# Patient Record
Sex: Female | Born: 1955 | Race: White | Hispanic: No | Smoking: Current every day smoker
Health system: Southern US, Community
[De-identification: ages and names within clinical notes are randomized; demographics above are authoritative.]

## PROBLEM LIST (undated history)

## (undated) DIAGNOSIS — I10 Essential (primary) hypertension: Secondary | ICD-10-CM

## (undated) DIAGNOSIS — T8859XA Other complications of anesthesia, initial encounter: Secondary | ICD-10-CM

## (undated) DIAGNOSIS — K219 Gastro-esophageal reflux disease without esophagitis: Secondary | ICD-10-CM

## (undated) DIAGNOSIS — Z933 Colostomy status: Secondary | ICD-10-CM

## (undated) DIAGNOSIS — T4145XA Adverse effect of unspecified anesthetic, initial encounter: Secondary | ICD-10-CM

## (undated) DIAGNOSIS — J449 Chronic obstructive pulmonary disease, unspecified: Secondary | ICD-10-CM

## (undated) DIAGNOSIS — K5792 Diverticulitis of intestine, part unspecified, without perforation or abscess without bleeding: Secondary | ICD-10-CM

## (undated) HISTORY — PX: BACK SURGERY: SHX140

## (undated) HISTORY — PX: TONSILLECTOMY: SUR1361

---

## 2002-07-30 ENCOUNTER — Ambulatory Visit (HOSPITAL_COMMUNITY): Admission: RE | Admit: 2002-07-30 | Discharge: 2002-07-30 | Payer: Self-pay | Admitting: Obstetrics and Gynecology

## 2002-07-30 ENCOUNTER — Encounter: Payer: Self-pay | Admitting: Obstetrics and Gynecology

## 2004-08-09 ENCOUNTER — Ambulatory Visit (HOSPITAL_COMMUNITY): Admission: RE | Admit: 2004-08-09 | Discharge: 2004-08-09 | Payer: Self-pay | Admitting: Family Medicine

## 2004-11-15 ENCOUNTER — Ambulatory Visit (HOSPITAL_COMMUNITY): Admission: RE | Admit: 2004-11-15 | Discharge: 2004-11-15 | Payer: Self-pay | Admitting: Family Medicine

## 2005-08-22 ENCOUNTER — Ambulatory Visit (HOSPITAL_COMMUNITY): Admission: RE | Admit: 2005-08-22 | Discharge: 2005-08-22 | Payer: Self-pay | Admitting: Family Medicine

## 2005-09-04 ENCOUNTER — Other Ambulatory Visit: Admission: RE | Admit: 2005-09-04 | Discharge: 2005-09-04 | Payer: Self-pay

## 2006-03-06 ENCOUNTER — Ambulatory Visit (HOSPITAL_COMMUNITY): Admission: RE | Admit: 2006-03-06 | Discharge: 2006-03-06 | Payer: Self-pay | Admitting: Family Medicine

## 2012-04-19 ENCOUNTER — Emergency Department (HOSPITAL_COMMUNITY)
Admission: EM | Admit: 2012-04-19 | Discharge: 2012-04-19 | Disposition: A | Discharge status: Home / Self Care | Payer: Self-pay | Attending: Emergency Medicine | Admitting: Emergency Medicine

## 2012-04-19 ENCOUNTER — Encounter (HOSPITAL_COMMUNITY): Payer: Self-pay | Admitting: Emergency Medicine

## 2012-04-19 ENCOUNTER — Emergency Department (HOSPITAL_COMMUNITY): Payer: Self-pay

## 2012-04-19 DIAGNOSIS — Y92009 Unspecified place in unspecified non-institutional (private) residence as the place of occurrence of the external cause: Secondary | ICD-10-CM | POA: Insufficient documentation

## 2012-04-19 DIAGNOSIS — R059 Cough, unspecified: Secondary | ICD-10-CM | POA: Insufficient documentation

## 2012-04-19 DIAGNOSIS — S82142A Displaced bicondylar fracture of left tibia, initial encounter for closed fracture: Secondary | ICD-10-CM

## 2012-04-19 DIAGNOSIS — S82109A Unspecified fracture of upper end of unspecified tibia, initial encounter for closed fracture: Secondary | ICD-10-CM | POA: Insufficient documentation

## 2012-04-19 DIAGNOSIS — R002 Palpitations: Secondary | ICD-10-CM | POA: Insufficient documentation

## 2012-04-19 DIAGNOSIS — T148XXA Other injury of unspecified body region, initial encounter: Secondary | ICD-10-CM | POA: Insufficient documentation

## 2012-04-19 DIAGNOSIS — R05 Cough: Secondary | ICD-10-CM | POA: Insufficient documentation

## 2012-04-19 HISTORY — DX: Essential (primary) hypertension: I10

## 2012-04-19 MED ORDER — HYDROCODONE-ACETAMINOPHEN 5-325 MG PO TABS: 1.0000 | ORAL_TABLET | Freq: Four times a day (QID) | ORAL | Status: AC | PRN

## 2012-04-19 MED ORDER — NAPROXEN 500 MG PO TABS: 500.0000 mg | ORAL_TABLET | Freq: Two times a day (BID) | ORAL | Status: AC

## 2012-04-19 MED ORDER — TETANUS-DIPHTH-ACELL PERTUSSIS 5-2.5-18.5 LF-MCG/0.5 IM SUSP
0.5000 mL | Freq: Once | INTRAMUSCULAR | Status: AC
  Administered 2012-04-19: 0.5 mL via INTRAMUSCULAR
  Filled 2012-04-19: qty 0.5

## 2012-04-19 MED ORDER — AMOXICILLIN-POT CLAVULANATE 875-125 MG PO TABS: 1.0000 | ORAL_TABLET | Freq: Two times a day (BID) | ORAL | Status: AC
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
## 2012-04-19 NOTE — Discharge Instructions (Signed)
Views left knee immobilizer all times except for during showering use crutches at all times. Referral made to both orthopedic surgeons in town and one a call for followup for the tibial plateau fracture on Monday. Take antibiotic as directed for the heel puncture wound. Tetanus updated today. Return as needed.   RESOURCE GUIDE  Chronic Pain Problems: Contact [facility]  [phone] Patients need to be referred by their primary care doctor.  Insufficient Money for Medicine: Contact United Way:  call "211" or [facility] [phone].  No Primary Care Doctor: - Call [facility]  [phone] - can help you locate a primary care doctor that  accepts your insurance, provides certain services, etc. - Physician Referral Service- [phone]  Agencies that provide inexpensive medical care: - [facility]  [phone] - [facility]  [phone] - [facility]  [phone] - Women's Clinic  [phone] - [facility]  [phone] [facility]  [phone]  Medicaid-accepting [facility] Providers: - [facility]- [address]  [phone], Mon-Fri 9am-7pm, Sat 9am-1pm - [facility]- [address] [place], Suite [place]  [phone] - [facility]- [address] [place]  [phone] [facility]- [address]  [phone] - Renaye Rakers- [address] [place], [address], [phone]  Only accepts [place] patients after they have their name  applied to their card  Self Pay (no insurance) in [place]: - Sickle Cell Patients: Dr Willey Blade, [facility]  [address] [place], [phone] - [facility]- [address] [place]  [phone]       [facility]- [address]       -     [facility]- see information above (Speak to [employer] if you  do not have insurance)       -  [facility]- [address] [place], [phone]       -  [facility]- [address],  [phone]       -  [facility]- [address] [phone]       -  Dr Julio Sicks-  [address], [place], [phone]       -  [facility]- [address] [phone]       -  [facility]- [address] [phone], also [address], [phone]       -    [facility]- [address] [place], [phone], 1st & 3rd Saturday   every month, 10am-1pm  1) Find a Doctor and Pay Out of Pocket Although you won't have to find out who is covered by your insurance plan, it is a good idea to ask around and get recommendations. You will then need to call the office and see if the doctor you have chosen will accept you as a new patient and what types of options they offer for patients who are self-pay. Some doctors offer discounts or will set up payment plans for their patients who do not have insurance, but you will need to ask so you aren't surprised when you get to your appointment.  2) Contact Your Local Health Department Not all health departments have doctors that can see patients for  sick visits, but many do, so it is worth a call to see if yours does. If you don't know where your local health department is, you can check in your phone book. The CDC also has a tool to help you locate your state's health department, and many state websites also have listings of all of their local health departments.  3) Find a Walk-in Clinic If your illness is not likely to be very severe or complicated, you may want to try a walk in clinic. These are popping up all over the country in pharmacies, drugstores, and shopping centers. They're usually staffed by nurse practitioners or physician assistants that have been trained to treat common illnesses and complaints. They're usually fairly quick and inexpensive. However, if you have  serious medical issues or chronic medical problems, these are probably not your best option  STD Testing - [facility] Department of [facility], STD Clinic, [address] [place], phone [phone] or [phone].  Monday - Friday, call for an appointment. [facility] Department of [employer], STD Clinic, [address], [place], phone [phone] or [phone].  Monday - Friday, call for an appointment.  Abuse/Neglect: [facility] Child Abuse Hotline 250 008 8013 [facility] Child Abuse Hotline (510)730-1198 (After Hours)  Emergency Shelter:  [facility] 3150113134  Maternity Homes: - Room at the [place] [facility] [phone] - [facility] [phone]  MRSA Hotline #:   925 126 9159  [facility] Resources  [facility]  [employer] [facility] Dept. [address]                 [address]         [address] [place] [address]  Blondell Reveal Phone:  [phone]                                  Phone:  [phone]                   Phone:  ([phone]  [facility], [phone] - [phone]       -     [facility] in [place], [address],                                  ([phone], [facility] Child Abuse Hotline 330-479-3501 or (587)842-1306 (After Hours)   Behavioral Health Services  Substance Abuse Resources: - [facility]  769-774-4213 - [facility] (519) [phone] - [facility] [place] [phone] Floyd[facility] Flock [phone]) [phone] - Residential & Outpatient Substance Abuse Program  [phone]  Psychological Services: [facility]  [phone] Services  ([phone] - [facility], ([phone] [place]. [address] [place], ACCESS LINE:  212-740-3468 or [phone], [employer]  Dental Assistance  If unable to pay or uninsured, contact:  [facility] or [facility]. to become qualified for the adult dental clinic.  Patients with Medicaid: [facility] [phone] [address], [phone] [address] [phone]  If unable to pay, or uninsured, contact [facility] (541)350-3448) or [facility] [phone] in [place], [phone] in [facility]) to become qualified for the adult dental clinic  Other Low-Cost Community Dental Services: - [facility]- [address] [place], [place], [place], [address], [phone], [address]. [phone], 2nd and 4th Thursday of the month at 6:30am.  10 clients each day by appointment, can sometimes see walk-in patients if someone does not show for an appointment. [facility]- [address] [place], [place], [address], [phone] - [facility]- [address] [place], [place], [address], [phone] - [facility]- [phone] [facility]- [phone] [facility] Department- [phone]
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
## 2012-04-21 ENCOUNTER — Telehealth: Payer: Self-pay | Admitting: Orthopedic Surgery
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
## 2012-04-24 NOTE — Telephone Encounter (Signed)
She does not need an appointment at this time. When she can get her insurance issues together and/or appropriate funds she can come in for an review that her fracture is minimally displaced and would be treated in a brace anyway.

## 2012-04-24 NOTE — Telephone Encounter (Signed)
Unable to reach patient as she had explained she does not have a phone (NOT at her home ph#) therefore would be calling us.  Will advise per Dr. Mort Sawyers response when she contacts [place].

## 2012-05-12 ENCOUNTER — Ambulatory Visit (INDEPENDENT_AMBULATORY_CARE_PROVIDER_SITE_OTHER): Payer: Self-pay | Admitting: Orthopedic Surgery

## 2012-05-12 ENCOUNTER — Ambulatory Visit (INDEPENDENT_AMBULATORY_CARE_PROVIDER_SITE_OTHER): Payer: Self-pay

## 2012-05-12 ENCOUNTER — Encounter: Payer: Self-pay | Admitting: Orthopedic Surgery

## 2012-05-12 VITALS — BP 140/90 | Ht 66.0 in | Wt 170.0 lb

## 2012-05-12 DIAGNOSIS — S82109A Unspecified fracture of upper end of unspecified tibia, initial encounter for closed fracture: Secondary | ICD-10-CM

## 2012-05-12 DIAGNOSIS — S82143A Displaced bicondylar fracture of unspecified tibia, initial encounter for closed fracture: Secondary | ICD-10-CM

## 2012-05-12 NOTE — Patient Instructions (Addendum)
Start weight  bearing with crutches / brace for 6 weeks   Start bending the knee 25 times 3 times daily

## 2012-05-15 ENCOUNTER — Encounter: Payer: Self-pay | Admitting: Orthopedic Surgery

## 2012-05-15 DIAGNOSIS — S82143A Displaced bicondylar fracture of unspecified tibia, initial encounter for closed fracture: Secondary | ICD-10-CM | POA: Insufficient documentation

## 2012-05-15 NOTE — Progress Notes (Signed)
  Subjective:    Patient ID: Glenda Hill, female    DOB: 03-31-1956, 56 y.o.   MRN: [id number]  HPI    Review of Systems  Musculoskeletal: Positive for myalgias, joint swelling, arthralgias and gait problem.  All other systems reviewed and are negative.       Objective:   Physical Exam  Constitutional: She is oriented to person, place, and time. She appears well-developed and well-nourished.  Neck: Neck supple.  Cardiovascular: Intact distal pulses.   Pulmonary/Chest: Effort normal.  Abdominal: She exhibits no distension.  Musculoskeletal:       BP 140/90  Ht 5\' 6"  (1.676 m)  Wt 170 lb (77.111 kg)  BMI 27.44 kg/m2  Normal grooming and hygiene she is oriented x3  Her left knee is stiff and swollen and tender especially over the lateral plateau ligaments are stable including collateral leg the skin is intact distal pulses are excellent there is no lymphadenopathy sensations normal reflexes cannot be assessed right to left overall coordination and balance normal  Neurological: She is alert and oriented to person, place, and time. She has normal reflexes. She displays normal reflexes. No cranial nerve deficit. She exhibits normal muscle tone. Coordination normal.  Skin: Skin is warm and dry.  Psychiatric: She has a normal mood and affect. Her behavior is normal. Judgment and thought content normal.   X-rays show nondisplaced minimally depressed lateral plateau fracture       Assessment & Plan:  Recommend hinged knee brace with T. scope brace weight-bear as tolerated active range of motion return 6 weeks for x-ray

## 2012-07-07 ENCOUNTER — Ambulatory Visit (INDEPENDENT_AMBULATORY_CARE_PROVIDER_SITE_OTHER): Payer: Self-pay | Admitting: Orthopedic Surgery

## 2012-07-07 ENCOUNTER — Encounter: Payer: Self-pay | Admitting: Orthopedic Surgery

## 2012-07-07 VITALS — BP 120/78 | Ht 66.0 in | Wt 166.0 lb

## 2012-07-07 DIAGNOSIS — S82143A Displaced bicondylar fracture of unspecified tibia, initial encounter for closed fracture: Secondary | ICD-10-CM

## 2012-07-07 DIAGNOSIS — S82109A Unspecified fracture of upper end of unspecified tibia, initial encounter for closed fracture: Secondary | ICD-10-CM
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
## 2012-07-07 NOTE — Patient Instructions (Signed)
Wean yourself out of the brace

## 2013-06-15 DIAGNOSIS — Z01810 Encounter for preprocedural cardiovascular examination: Secondary | ICD-10-CM

## 2014-12-28 ENCOUNTER — Encounter (HOSPITAL_COMMUNITY): Payer: Self-pay | Admitting: Emergency Medicine

## 2014-12-28 ENCOUNTER — Emergency Department (HOSPITAL_COMMUNITY)
Admission: EM | Admit: 2014-12-28 | Discharge: 2014-12-28 | Disposition: A | Discharge status: Home / Self Care | Payer: 59 | Attending: Emergency Medicine | Admitting: Emergency Medicine

## 2014-12-28 ENCOUNTER — Emergency Department (HOSPITAL_COMMUNITY): Payer: 59

## 2014-12-28 DIAGNOSIS — M791 Myalgia: Secondary | ICD-10-CM | POA: Diagnosis not present

## 2014-12-28 DIAGNOSIS — I10 Essential (primary) hypertension: Secondary | ICD-10-CM | POA: Insufficient documentation

## 2014-12-28 DIAGNOSIS — R1013 Epigastric pain: Secondary | ICD-10-CM | POA: Diagnosis not present

## 2014-12-28 DIAGNOSIS — R112 Nausea with vomiting, unspecified: Secondary | ICD-10-CM | POA: Insufficient documentation

## 2014-12-28 DIAGNOSIS — Z720 Tobacco use: Secondary | ICD-10-CM | POA: Insufficient documentation

## 2014-12-28 DIAGNOSIS — M545 Low back pain: Secondary | ICD-10-CM | POA: Diagnosis not present

## 2014-12-28 DIAGNOSIS — M542 Cervicalgia: Secondary | ICD-10-CM | POA: Insufficient documentation

## 2014-12-28 DIAGNOSIS — R079 Chest pain, unspecified: Secondary | ICD-10-CM | POA: Diagnosis present

## 2014-12-28 LAB — CBC WITH DIFFERENTIAL/PLATELET
Basophils Absolute: 0.2 10*3/uL — ABNORMAL HIGH (ref 0.0–0.1)
Basophils Relative: 3 % — ABNORMAL HIGH (ref 0–1)
Eosinophils Absolute: 0.3 10*3/uL (ref 0.0–0.7)
Eosinophils Relative: 5 % (ref 0–5)
HCT: 38.5 % (ref 36.0–46.0)
Hemoglobin: 12.7 g/dL (ref 12.0–15.0)
Lymphocytes Relative: 45 % (ref 12–46)
Lymphs Abs: 3.1 10*3/uL (ref 0.7–4.0)
MCH: 29.3 pg (ref 26.0–34.0)
MCHC: 33 g/dL (ref 30.0–36.0)
MCV: 88.7 fL (ref 78.0–100.0)
Monocytes Absolute: 0.4 10*3/uL (ref 0.1–1.0)
Monocytes Relative: 6 % (ref 3–12)
Neutro Abs: 2.8 10*3/uL (ref 1.7–7.7)
Neutrophils Relative %: 42 % — ABNORMAL LOW (ref 43–77)
Platelets: 240 10*3/uL (ref 150–400)
RBC: 4.34 MIL/uL (ref 3.87–5.11)
RDW: 13.7 % (ref 11.5–15.5)
WBC: 6.8 10*3/uL (ref 4.0–10.5)

## 2014-12-28 LAB — COMPREHENSIVE METABOLIC PANEL
ALK PHOS: 68 U/L (ref 39–117)
ALT: 11 U/L (ref 0–35)
AST: 16 U/L (ref 0–37)
Albumin: 4.1 g/dL (ref 3.5–5.2)
Anion gap: 4 — ABNORMAL LOW (ref 5–15)
BUN: 30 mg/dL — ABNORMAL HIGH (ref 6–23)
CO2: 27 mmol/L (ref 19–32)
Calcium: 9.3 mg/dL (ref 8.4–10.5)
Chloride: 109 mmol/L (ref 96–112)
Creatinine, Ser: 0.8 mg/dL (ref 0.50–1.10)
GFR calc Af Amer: >90 mL/min (ref >90)
GFR calc non Af Amer: 80 mL/min — ABNORMAL LOW (ref >90)
GLUCOSE: 97 mg/dL (ref 70–99)
POTASSIUM: 3.4 mmol/L — AB (ref 3.5–5.1)
Sodium: 140 mmol/L (ref 135–145)
Total Bilirubin: 0.4 mg/dL (ref 0.3–1.2)
Total Protein: 6.6 g/dL (ref 6.0–8.3)

## 2014-12-28 LAB — TROPONIN I: Troponin I: <0.03 ng/mL (ref <0.031)

## 2014-12-28 LAB — LIPASE, BLOOD: LIPASE: 26 U/L (ref 11–59)

## 2014-12-28 MED ORDER — OXYCODONE-ACETAMINOPHEN 5-325 MG PO TABS: 1.0000 | ORAL_TABLET | ORAL | Status: DC | PRN

## 2014-12-28 MED ORDER — ONDANSETRON HCL 4 MG/2ML IJ SOLN: 4.0000 mg | Freq: Once | INTRAMUSCULAR | Status: DC

## 2014-12-28 MED ORDER — MORPHINE SULFATE 2 MG/ML IJ SOLN: 2.0000 mg | Freq: Once | INTRAMUSCULAR | Status: DC

## 2014-12-28 NOTE — ED Provider Notes (Signed)
CSN: [id number]     Arrival date & time 12/28/14  1842 History  This chart was scribed for Hilario Quarryanielle S , MD by Richarda Overlieichard Holland, ED Scribe. This patient was seen in room APA02/APA02 and the patient's care was started 8:32 PM.    Chief Complaint  Patient presents with  . Chest Pain   Patient is a 59 y.o. female presenting with chest pain. The history is provided by the patient. No language interpreter was used.  Chest Pain Pain location:  Epigastric Pain radiates to the back: yes   Pain severity:  Mild Duration:  24 hours Timing:  Constant Progression:  Unable to specify Associated symptoms: back pain, nausea and vomiting   Associated symptoms: no shortness of breath    HPI Comments: Glenda Hill is a 59 y.o. female with a history of HTN who presents to the Emergency Department complaining of constant CP that started last night around 8PM and worsened today. Pt rates her pain as a 4/10 at this time. She describes the pain as sharp and sometimes it subsides and turns into a dull pain. Pt reports associated nausea and says she vomited last night. She says that her CP radiates to her back. Pt also complains of neck pain. She says that she took 4 ibuprofen earlier which failed to provide relief to her symptoms. Pt reports a history of smoking and says she occasionally drinks. Pt reports a past surgical history of cyst removal on her lumbar spine. She denies SOB.   Pt also complains of pain in her left hip area which she thought was from her bed mattress. She states that she began sciatica treatment for her right leg pain 9 months ago.   Past Medical History  Diagnosis Date  . Hypertension    Past Surgical History  Procedure Laterality Date  . Cesarean section     Family History  Problem Relation Age of Onset  . Heart disease    . Arthritis    . Lung disease    . Cancer    . Diabetes     History  Substance Use Topics  . Smoking status: Current Every Day Smoker -- 1.00  packs/day    Types: Cigarettes  . Smokeless tobacco: Not on file  . Alcohol Use: Yes   OB History    No data available     Review of Systems  Respiratory: Negative for shortness of breath.   Cardiovascular: Positive for chest pain.  Gastrointestinal: Positive for nausea and vomiting.  Musculoskeletal: Positive for myalgias, back pain and neck pain.  All other systems reviewed and are negative.    Allergies  Review of patient's allergies indicates no known allergies.  Home Medications   Prior to Admission medications   Medication Sig Start Date End Date Taking? Authorizing Provider  IBUPROFEN PO Take by mouth.    Historical Provider, MD  METOPROLOL TARTRATE PO Take by mouth.    Historical Provider, MD   BP 165/87 mmHg  Pulse 61  Temp(Src) 97.2 F (36.2 C) (Oral)  Resp 14  Ht 5\' 7"  (1.702 m)  Wt 140 lb (63.504 kg)  BMI 21.92 kg/m2  SpO2 97% Physical Exam  Constitutional: She is oriented to person, place, and time. She appears well-developed and well-nourished.  HENT:  Head: Normocephalic and atraumatic.  Right Ear: External ear normal.  Left Ear: External ear normal.  Nose: Nose normal.  Mouth/Throat: Oropharynx is clear and moist.  Eyes: Conjunctivae and EOM are normal. Pupils are  equal, round, and reactive to light. Right eye exhibits no discharge. Left eye exhibits no discharge.  Neck: Normal range of motion. Neck supple. No JVD present. No tracheal deviation present. No thyromegaly present.  Cardiovascular: Normal rate, regular rhythm, normal heart sounds and intact distal pulses.   Pulmonary/Chest: Effort normal and breath sounds normal. No respiratory distress. She has no wheezes.  Abdominal: Soft. Bowel sounds are normal. She exhibits no distension and no mass. There is tenderness. There is no rebound and no guarding.    Musculoskeletal: Normal range of motion.  Lymphadenopathy:    She has no cervical adenopathy.  Neurological: She is alert and oriented to  person, place, and time. She has normal reflexes. No cranial nerve deficit or sensory deficit. Gait normal. GCS eye subscore is 4. GCS verbal subscore is 5. GCS motor subscore is 6.  Reflex Scores:      Bicep reflexes are 2+ on the right side and 2+ on the left side.      Patellar reflexes are 2+ on the right side and 2+ on the left side. Strength is normal and equal throughout. Cranial nerves grossly intact. Patient fluent. No gross ataxia and patient able to ambulate without difficulty.  Skin: Skin is warm and dry.  Psychiatric: She has a normal mood and affect. Her behavior is normal. Judgment and thought content normal.  Nursing note and vitals reviewed.   ED Course  Procedures   DIAGNOSTIC STUDIES: Oxygen Saturation is 97% on RA, normal by my interpretation.    COORDINATION OF CARE: 8:38 PM Discussed treatment plan with pt at bedside and pt agreed to plan.   Labs Review Labs Reviewed  CBC WITH DIFFERENTIAL/PLATELET - Abnormal; Notable for the following:    Neutrophils Relative % 42 (*)    Basophils Relative 3 (*)    Basophils Absolute 0.2 (*)    All other components within normal limits  COMPREHENSIVE METABOLIC PANEL - Abnormal; Notable for the following:    Potassium 3.4 (*)    BUN 30 (*)    GFR calc non Af Amer 80 (*)    Anion gap 4 (*)    All other components within normal limits  TROPONIN I  LIPASE, BLOOD    Imaging Review Dg Chest 2 View  12/28/2014   CLINICAL DATA:  Left-sided chest pain.  EXAM: CHEST  2 VIEW  COMPARISON:  June 15, 2013.  FINDINGS: The heart size and mediastinal contours are within normal limits. Both lungs are clear. No pneumothorax or pleural effusion is noted. The visualized skeletal structures are unremarkable.  IMPRESSION: No acute cardiopulmonary abnormality seen.   Electronically Signed   By: Lupita Raider, M.D.   On: 12/28/2014 19:34     EKG Interpretation   Date/Time:  Tuesday December 28 2014 18:54:35 EST Ventricular Rate:  62 PR  Interval:  134 QRS Duration: 86 QT Interval:  396 QTC Calculation: 401 R Axis:   77 Text Interpretation:  Normal sinus rhythm Normal ECG Confirmed by  MD,   ([id number]) on 12/28/2014 8:40:21 PM      MDM   Final diagnoses:  Epigastric abdominal pain   I personally performed the services described in this documentation, which was scribed in my presence. The recorded information has been reviewed and considered.      Hilario Quarry, MD 12/28/14 [phone]

## 2014-12-28 NOTE — ED Notes (Signed)
Pt c/o left chest pain since 1300 with radiation down left side, left jaw and into back. Pt reports feeling her heart start racing then slow down. Pain is intermittent and fluctuates in severity.

## 2014-12-28 NOTE — Discharge Instructions (Signed)

## 2014-12-28 NOTE — ED Notes (Signed)
Patient states that she has to drive home and does not care to take the pain medication. Spoke with Dr. Rosalia Hammersay concerning the medication. She will come talk to the patient.

## 2014-12-29 ENCOUNTER — Ambulatory Visit (HOSPITAL_COMMUNITY)
Admit: 2014-12-29 | Discharge: 2014-12-29 | Disposition: A | Discharge status: Home / Self Care | Payer: 59 | Source: Ambulatory Visit | Attending: Emergency Medicine | Admitting: Emergency Medicine

## 2014-12-29 ENCOUNTER — Other Ambulatory Visit (HOSPITAL_COMMUNITY): Payer: Self-pay | Admitting: Emergency Medicine

## 2014-12-29 DIAGNOSIS — R1013 Epigastric pain: Secondary | ICD-10-CM

## 2015-01-03 MED FILL — Oxycodone w/ Acetaminophen Tab 5-325 MG: ORAL | Qty: 6 | Status: AC

## 2015-02-09 ENCOUNTER — Emergency Department (HOSPITAL_COMMUNITY): Payer: 59

## 2015-02-09 ENCOUNTER — Emergency Department (HOSPITAL_COMMUNITY)
Admission: EM | Admit: 2015-02-09 | Discharge: 2015-02-09 | Disposition: A | Discharge status: Home / Self Care | Payer: 59 | Attending: Emergency Medicine | Admitting: Emergency Medicine

## 2015-02-09 ENCOUNTER — Encounter (HOSPITAL_COMMUNITY): Payer: Self-pay | Admitting: *Deleted

## 2015-02-09 DIAGNOSIS — Y9289 Other specified places as the place of occurrence of the external cause: Secondary | ICD-10-CM | POA: Insufficient documentation

## 2015-02-09 DIAGNOSIS — Y998 Other external cause status: Secondary | ICD-10-CM | POA: Diagnosis not present

## 2015-02-09 DIAGNOSIS — S8991XA Unspecified injury of right lower leg, initial encounter: Secondary | ICD-10-CM | POA: Diagnosis not present

## 2015-02-09 DIAGNOSIS — Z720 Tobacco use: Secondary | ICD-10-CM | POA: Diagnosis not present

## 2015-02-09 DIAGNOSIS — Y9389 Activity, other specified: Secondary | ICD-10-CM | POA: Insufficient documentation

## 2015-02-09 DIAGNOSIS — I10 Essential (primary) hypertension: Secondary | ICD-10-CM | POA: Diagnosis not present

## 2015-02-09 DIAGNOSIS — M25561 Pain in right knee: Secondary | ICD-10-CM

## 2015-02-09 DIAGNOSIS — X58XXXA Exposure to other specified factors, initial encounter: Secondary | ICD-10-CM | POA: Insufficient documentation

## 2015-02-09 DIAGNOSIS — Z79899 Other long term (current) drug therapy: Secondary | ICD-10-CM | POA: Insufficient documentation

## 2015-02-09 MED ORDER — OXYCODONE-ACETAMINOPHEN 5-325 MG PO TABS
1.0000 | ORAL_TABLET | Freq: Once | ORAL | Status: AC
  Administered 2015-02-09: 1 via ORAL
  Filled 2015-02-09: qty 1

## 2015-02-09 MED ORDER — OXYCODONE-ACETAMINOPHEN 5-325 MG PO TABS: 1.0000 | ORAL_TABLET | Freq: Four times a day (QID) | ORAL | Status: DC | PRN

## 2015-02-09 NOTE — ED Provider Notes (Signed)
CSN: [id number]     Arrival date & time 02/09/15  [id number] History   First MD Initiated Contact with Patient 02/09/15 [phone]     Chief Complaint  Patient presents with  . Knee Pain     (Consider location/radiation/quality/duration/timing/severity/associated sxs/prior Treatment) HPI  This a 59 year old female with a history of hypertension who presents with right knee pain. Patient reports approximately 2 weeks ago she twisted her knee and heard a "pop." Since that time she has had pain with ambulation. Pain is worsened with standing for long durations. Tonight she was work and reported 10 out of 10 pain. When she is sitting it is 7 out of 10. It is a throbbing and nonradiating pain. She's not noted any overlying skin changes. Denies any fevers. Has taken ibuprofen with minimal relief.  Past Medical History  Diagnosis Date  . Hypertension    Past Surgical History  Procedure Laterality Date  . Cesarean section     Family History  Problem Relation Age of Onset  . Heart disease    . Arthritis    . Lung disease    . Cancer    . Diabetes     History  Substance Use Topics  . Smoking status: Current Every Day Smoker -- 1.00 packs/day    Types: Cigarettes  . Smokeless tobacco: Not on file  . Alcohol Use: Yes   OB History    No data available     Review of Systems  Constitutional: Negative for fever.  Musculoskeletal:       Right knee pain  Skin: Negative for color change.  All other systems reviewed and are negative.     Allergies  Review of patient's allergies indicates no known allergies.  Home Medications   Prior to Admission medications   Medication Sig Start Date End Date Taking? Authorizing Provider  IBUPROFEN PO Take by mouth.    Historical Provider, MD  METOPROLOL TARTRATE PO Take by mouth.    Historical Provider, MD  oxyCODONE-acetaminophen (PERCOCET/ROXICET) 5-325 MG per tablet Take 1 tablet by mouth every 6 (six) hours as needed for severe pain. 02/09/15    Shon Batonourtney F , MD   BP 168/95 mmHg  Pulse 70  Temp(Src) 97.6 F (36.4 C) (Oral)  Resp 18  Ht 5\' 7"  (1.702 m)  Wt 135 lb (61.236 kg)  BMI 21.14 kg/m2  SpO2 99% Physical Exam  Constitutional: She is oriented to person, place, and time. She appears well-developed and well-nourished.  HENT:  Head: Normocephalic and atraumatic.  Mouth/Throat: Oropharynx is clear and moist.  Cardiovascular: Normal rate and regular rhythm.   Pulmonary/Chest: Effort normal. No respiratory distress.  Musculoskeletal:  Focused examination of the right knee reveals normal range of motion, no joint line tenderness, minimal swelling, no significant laxity, no overlying skin changes, crepitus with range of motion  Neurological: She is alert and oriented to person, place, and time.  Skin: Skin is warm and dry.  Psychiatric: She has a normal mood and affect.  Nursing note and vitals reviewed.   ED Course  Procedures (including critical care time) Labs Review Labs Reviewed - No data to display  Imaging Review Dg Knee Complete 4 Views Right  02/09/2015   CLINICAL DATA:  Left knee pain. Felt a pop 2 weeks prior, increasing pain since that time.  EXAM: RIGHT KNEE - COMPLETE 4+ VIEW  COMPARISON:  None.  FINDINGS: No acute fracture. Mild moderate medial tibial femoral joint space narrowing with small peripheral osteophytes. There is  a 9 mm osseous projection from the proximal lateral tibial metaphysis with cortical and medullary continuity consistent with osteochondroma. Moderate joint effusion.  IMPRESSION: 1. Moderate joint effusion.  No acute osseous abnormality. 2. 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 3. Small osteochondroma projecting from the lateral tibial metaphysis measures 9 mm.   Electronically Signed   By: Rubye Oaks M.D.   On: 02/09/2015 04:19     EKG Interpretation None      MDM   Final diagnoses:  Knee pain, acute, right    Patient presents with knee pain.  No evidence of infection. Reports twisting injury. Patient given Percocet. Plain films negative. Suspect ligamentous versus meniscal injury. There was a moderate effusion on x-ray. Discuss with patient rest, ice, compression, and elevation. Patient was provided with an Ace bandage. Given orthopedics referral if pain does not improve.  After history, exam, and medical workup I feel the patient has been appropriately medically screened and is safe for discharge home. Pertinent diagnoses were discussed with the patient. Patient was given return precautions.     Shon Baton, MD 02/09/15 [phone]

## 2015-02-09 NOTE — Discharge Instructions (Signed)

## 2015-02-09 NOTE — ED Notes (Signed)
Pt c/o right knee pain; pt states she heard a "pop" 2 weeks ago and the pain has been getting progressively worse; pt states she was at work tonight when the pain got worse

## 2015-02-09 NOTE — ED Notes (Signed)
Ace wrap applied to right knee

## 2015-12-25 ENCOUNTER — Emergency Department (HOSPITAL_COMMUNITY)
Admission: EM | Admit: 2015-12-25 | Discharge: 2015-12-25 | Disposition: A | Discharge status: Home / Self Care | Payer: BLUE CROSS/BLUE SHIELD | Attending: Emergency Medicine | Admitting: Emergency Medicine

## 2015-12-25 ENCOUNTER — Emergency Department (HOSPITAL_COMMUNITY): Payer: BLUE CROSS/BLUE SHIELD

## 2015-12-25 ENCOUNTER — Encounter (HOSPITAL_COMMUNITY): Payer: Self-pay | Admitting: Nurse Practitioner

## 2015-12-25 DIAGNOSIS — F17210 Nicotine dependence, cigarettes, uncomplicated: Secondary | ICD-10-CM | POA: Insufficient documentation

## 2015-12-25 DIAGNOSIS — I10 Essential (primary) hypertension: Secondary | ICD-10-CM | POA: Diagnosis not present

## 2015-12-25 DIAGNOSIS — R079 Chest pain, unspecified: Secondary | ICD-10-CM

## 2015-12-25 LAB — I-STAT TROPONIN, ED
TROPONIN I, POC: 0 ng/mL (ref 0.00–0.08)
Troponin i, poc: 0 ng/mL (ref 0.00–0.08)

## 2015-12-25 LAB — CBC
HCT: 42.9 % (ref 36.0–46.0)
Hemoglobin: 14.5 g/dL (ref 12.0–15.0)
MCH: 29.9 pg (ref 26.0–34.0)
MCHC: 33.8 g/dL (ref 30.0–36.0)
MCV: 88.5 fL (ref 78.0–100.0)
Platelets: 290 10*3/uL (ref 150–400)
RBC: 4.85 MIL/uL (ref 3.87–5.11)
RDW: 14.1 % (ref 11.5–15.5)
WBC: 6.3 10*3/uL (ref 4.0–10.5)

## 2015-12-25 LAB — BASIC METABOLIC PANEL
Anion gap: 16 — ABNORMAL HIGH (ref 5–15)
BUN: 10 mg/dL (ref 6–20)
CO2: 25 mmol/L (ref 22–32)
CREATININE: 0.71 mg/dL (ref 0.44–1.00)
Calcium: 9.7 mg/dL (ref 8.9–10.3)
Chloride: 99 mmol/L — ABNORMAL LOW (ref 101–111)
GFR calc Af Amer: >60 mL/min (ref >60)
GFR calc non Af Amer: >60 mL/min (ref >60)
Glucose, Bld: 98 mg/dL (ref 65–99)
Potassium: 3.8 mmol/L (ref 3.5–5.1)
Sodium: 140 mmol/L (ref 135–145)

## 2015-12-25 MED ORDER — AMLODIPINE BESYLATE 10 MG PO TABS: 10.0000 mg | ORAL_TABLET | Freq: Every day | ORAL | Status: DC

## 2015-12-25 NOTE — ED Notes (Signed)
She c/o midsternal cp radiating into her back and L arm and nausea onset last night while exerting herself at work, pain persisted throughout the night. shes noticed her heart has been racing this week also. Denies sob. Has felt weaker than normal. A&Ox4, resp e/u

## 2015-12-25 NOTE — Discharge Instructions (Signed)

## 2015-12-25 NOTE — ED Provider Notes (Signed)
CSN: [id number]     Arrival date & time 12/25/15  1637 History   First MD Initiated Contact with Patient 12/25/15 1830     Chief Complaint  Patient presents with  . Chest Pain     (Consider location/radiation/quality/duration/timing/severity/associated sxs/prior Treatment) HPI   54 y f w PMH htn who was at work last night when she began having central chest pain that radiated to her back and L arm.  She describes the pain as burning.  The pain was constant overnight but has improved from 10/10 to 3/10.  Patient hasn't taken anything for the pain at home.  No sob, no cough.  No fever/chills.  Past Medical History  Diagnosis Date  . Hypertension    Past Surgical History  Procedure Laterality Date  . Cesarean section    . Back surgery     Family History  Problem Relation Age of Onset  . Heart disease    . Arthritis    . Lung disease    . Cancer    . Diabetes     Social History  Substance Use Topics  . Smoking status: Current Every Day Smoker -- 1.00 packs/day    Types: Cigarettes  . Smokeless tobacco: None  . Alcohol Use: Yes   OB History    No data available     Review of Systems  Constitutional: Negative for fever and chills.  HENT: Negative for nosebleeds.   Eyes: Negative for visual disturbance.  Respiratory: Negative for cough and shortness of breath.   Cardiovascular: Positive for chest pain.  Gastrointestinal: Negative for nausea, vomiting, abdominal pain, diarrhea and constipation.  Genitourinary: Negative for dysuria.  Skin: Negative for rash.  Neurological: Negative for weakness.  All other systems reviewed and are negative.     Allergies  Review of patient's allergies indicates no known allergies.  Home Medications   Prior to Admission medications   Medication Sig Start Date End Date Taking? Authorizing Provider  IBUPROFEN PO Take by mouth.    Historical Provider, MD  METOPROLOL TARTRATE PO Take by mouth.    Historical Provider, MD   oxyCODONE-acetaminophen (PERCOCET/ROXICET) 5-325 MG per tablet Take 1 tablet by mouth every 6 (six) hours as needed for severe pain. 02/09/15   Shon Baton, MD   BP 152/64 mmHg  Pulse 72  Temp(Src) 97.9 F (36.6 C) (Oral)  Resp 20  SpO2 96% Physical Exam  Constitutional: She is oriented to person, place, and time. No distress.  HENT:  Head: Normocephalic and atraumatic.  Eyes: EOM are normal. Pupils are equal, round, and reactive to light.  Neck: Normal range of motion. Neck supple.  Cardiovascular: Normal rate, normal heart sounds and intact distal pulses.   Pulmonary/Chest: Effort normal and breath sounds normal. No respiratory distress.  Abdominal: Soft. There is no tenderness.  Musculoskeletal: Normal range of motion.  Neurological: She is alert and oriented to person, place, and time.  Skin: No rash noted. She is not diaphoretic.  Psychiatric: She has a normal mood and affect.    ED Course  Procedures (including critical care time) Labs Review Labs Reviewed  BASIC METABOLIC PANEL - Abnormal; Notable for the following:    Chloride 99 (*)    Anion gap 16 (*)    All other components within normal limits  CBC  I-STAT TROPOININ, ED  Rosezena Sensor, ED    Imaging Review Dg Chest 2 View  12/25/2015  CLINICAL DATA:  Initial evaluation for acute centralized chest pain radiating  to back. EXAM: CHEST  2 VIEW COMPARISON:  Prior radiograph from 12/29/2014. FINDINGS: The cardiac and mediastinal silhouettes are stable in size and contour, and remain within normal limits. Atheromatous plaque within the aortic arch noted. The lungs are normally inflated. No airspace consolidation, pleural effusion, or pulmonary edema is identified. There is no pneumothorax. No acute osseous abnormality identified. IMPRESSION: No active cardiopulmonary disease. Electronically Signed   By: Rise Mu M.D.   On: 12/25/2015 17:50   I have personally reviewed and evaluated these images and  lab results as part of my medical decision-making.   EKG Interpretation   Date/Time:  Sunday December 25 2015 16:45:15 EST Ventricular Rate:  83 PR Interval:  134 QRS Duration: 84 QT Interval:  368 QTC Calculation: 432 R Axis:   77 Text Interpretation:  Normal sinus rhythm Right atrial enlargement  Moderate voltage criteria for LVH, may be normal variant Borderline ECG  Confirmed by ZAVITZ  MD, JOSHUA ([id number]) on 12/25/2015 8:08:42 PM      MDM   Final diagnoses:  Chest pain, unspecified chest pain type    47 y f w PMH htn who was at work last night when she began having central chest pain that radiated to her back and L arm.  Exam as above.  AFVSS.  No murmur heard  ekg w/o ischemic changes.  Cbc/bmp wnl.  Trop neg. cxr wnl. HEAR of 3.  Doubt dissection given age, pain improving, equal radial pulses bilaterally.  Normal mediastinum on cxr Doubt PE given no prev dvt/pe, not pleuritic pain, not tachy, no hypoxia, no leg swelling Have done delta trop which is neg.  Pain is improving.  Feel safe for d/c at this time with close cards f/u  I have discussed the results, Dx and Tx plan with the pt. They expressed understanding and agree with the plan and were told to return to ED with any worsening of condition or concern.    Disposition: Discharge  Condition: Good  New Prescriptions   No medications on file    Follow Up: [facility] [address] [place] [address] [phone] Schedule an appointment as soon as possible for a visit in 3 days    Pt seen in conjunction with Dr. Nelda Bucks, MD 12/25/15 2011  Blane Ohara, MD 12/26/15 [phone]

## 2015-12-26 NOTE — Progress Notes (Signed)
Patient ID: Glenda Hill, female   DOB: January 20, 1956, 60 y.o.   MRN: [id number]     Cardiology Office Note   Date:  12/27/2015   [name], DOB [birth year], MRN [id number]  PCP:  No PCP Per Patient  Cardiologist:   Charlton Haws, MD   Chief Complaint  Patient presents with  . Establish Care      History of Present Illness: 60 y.o.  f w PMH htn who was seen in ER 12/25/15 with chest pain.  At work  when she began having central chest pain that radiated to her back and L arm. She describes the pain as burning. The pain was constant overnight and persisted in ER visit.  Patient hasn't taken anything for the pain at home. No sob, no cough. No fever/chills. She r/o had no acute ECG changes and negative tropinin .  CRF;  HTN   CXR NAD no CE and normal mediastinum reviewed.   She has over 50 py history smoking and clinical COPD.  No primary  Has not been on BP pills Did not think norvasc effective in past Describes cough with  Lisinopril.    Pain started after lifting heavy items at work. 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  Not pleuritic  Piercing stabbing pain including epigastric area       Past Medical History  Diagnosis Date  . Hypertension     Past Surgical History  Procedure Laterality Date  . Cesarean section    . Back surgery       Current Outpatient Prescriptions  Medication Sig Dispense Refill  . amLODipine (NORVASC) 10 MG tablet Take 1 tablet (10 mg total) by mouth daily. 30 tablet 0  . ibuprofen (ADVIL,MOTRIN) 200 MG tablet Take 400 mg by mouth every 6 (six) hours as needed for headache (pain).    . Multiple Vitamin (MULTIVITAMIN WITH MINERALS) TABS tablet Take 1 tablet by mouth daily.     No current facility-administered medications for this visit.    Allergies:   Review of patient's allergies indicates no known allergies.    Social History:  The patient  reports that she has been smoking  Cigarettes.  She has been smoking about 1.00 pack per day. She does not have any smokeless tobacco history on file. She reports that she drinks alcohol. She reports that she does not use illicit drugs.   Family History:  The patient's family history is not on file.    ROS:  Please see the history of present illness.   Otherwise, review of systems are positive for none.   All other systems are reviewed and negative.    PHYSICAL EXAM: VS:  BP 156/98 mmHg  Pulse 68  Ht  (1.702 m)  Wt 69.854 kg (154 lb)  BMI 24.11 kg/m2  SpO2 95% , BMI Body mass index is 24.11 kg/(m^2). Affect appropriate Middle aged female with COPD HEENT: normal Neck supple with no adenopathy JVP normal no bruits no thyromegaly Lungs clear with exp wheezing and good diaphragmatic motion Heart:  S1/S2 no murmur, no rub, gallop or click PMI normal Abdomen: benighn, BS positve, no tenderness,  Abdominal Ao palpable and mildly tender  no bruit.  No HSM or HJR Distal pulses intact with no bruits No edema Neuro non-focal Skin warm and dry No muscular weakness    EKG:  12/25/15  NSR rate 82 LVH no acute ST/T wave changes  no change from June 2013    Recent Labs: 12/28/2014: ALT 11 12/25/2015: BUN 10; Creatinine, Ser 0.71; Hemoglobin 14.5; Platelets 290; Potassium 3.8; Sodium 140    Lipid Panel No results found for: CHOL, TRIG, HDL, CHOLHDL, VLDL, LDLCALC, LDLDIRECT    Wt Readings from Last 3 Encounters:  12/27/15 69.854 kg (154 lb)  02/09/15 61.236 kg (135 lb)  12/28/14 63.504 kg (140 lb)      Other studies Reviewed: Additional studies/ records that were reviewed today include: ER notes, labs , ECG and CXR see HPI.    ASSESSMENT AND PLAN:  1.  Chest Pain atypical likely from back. Tramadol for pain.  F/U exercise myovue given risk factors 2. HTN: start Hyzaar 100/12.5 labs ok in ER 3. COPD:  Discussed smoking cessation CXR ok in ER refer to pulmonary and have them enroll her in lung cancer screening  program Her mother died from lung cancer 4. Palpable Ao with epigastric pain f/u US r/o AAA    Current medicines are reviewed at length with the patient today.  The patient does not have concerns regarding medicines.  The following changes have been made:  Hyzaar started   Labs/ tests ordered today include:  Abd [place], refer pulmonary exercise myovue   No orders of the defined types were placed in this encounter.     Disposition:   FU with me next available      Signed, Charlton Haws, MD  12/27/2015 3:38 PM    [facility] [address] [place], [place], [place]  [address] Phone: [phone]; Fax: [phone]

## 2015-12-27 ENCOUNTER — Encounter: Payer: Self-pay | Admitting: Cardiovascular Disease

## 2015-12-27 ENCOUNTER — Ambulatory Visit (INDEPENDENT_AMBULATORY_CARE_PROVIDER_SITE_OTHER): Payer: BLUE CROSS/BLUE SHIELD | Admitting: Cardiovascular Disease

## 2015-12-27 VITALS — BP 156/98 | HR 68 | Ht 67.0 in | Wt 154.0 lb

## 2015-12-27 DIAGNOSIS — Z7689 Persons encountering health services in other specified circumstances: Secondary | ICD-10-CM

## 2015-12-27 DIAGNOSIS — Z7189 Other specified counseling: Secondary | ICD-10-CM | POA: Diagnosis not present

## 2015-12-27 DIAGNOSIS — J449 Chronic obstructive pulmonary disease, unspecified: Secondary | ICD-10-CM

## 2015-12-27 DIAGNOSIS — R079 Chest pain, unspecified: Secondary | ICD-10-CM

## 2015-12-27 DIAGNOSIS — I714 Abdominal aortic aneurysm, without rupture, unspecified: Secondary | ICD-10-CM

## 2015-12-27 MED ORDER — TRAMADOL HCL 50 MG PO TABS: 50.0000 mg | ORAL_TABLET | Freq: Four times a day (QID) | ORAL | Status: DC | PRN

## 2015-12-27 MED ORDER — LOSARTAN POTASSIUM-HCTZ 100-12.5 MG PO TABS: 1.0000 | ORAL_TABLET | Freq: Every day | ORAL | Status: DC

## 2015-12-27 NOTE — Patient Instructions (Addendum)
Medication Instructions:  Your physician has recommended you make the following change in your medication:  1-STOP amlodipine  2-START Hyzaar 100/12.5 by mouth daily 3-START Tramadol 50 mg by mouth every 6 hours as needed for back pain.   Labwork: NONE  Testing/Procedures: Your physician has requested that you have en exercise stress myoview. For further information please visit [email or web address] Please follow instruction sheet, as given.  Your physician has requested that you have an abdominal aorta duplex. During this test, an ultrasound is used to evaluate the aorta. Allow 30 minutes for this exam. Do not eat after midnight the day before and avoid carbonated beverages  Follow-Up: Your physician wants you to follow-up next available with Dr. Eden Emms.  You have been referred to pulmonary for COPD   If you need a refill on your cardiac medications before your next appointment, please call your pharmacy.

## 2016-01-10 ENCOUNTER — Telehealth (HOSPITAL_COMMUNITY): Payer: Self-pay

## 2016-01-10 NOTE — Telephone Encounter (Signed)
Encounter complete. 

## 2016-01-12 ENCOUNTER — Other Ambulatory Visit: Payer: Self-pay | Admitting: Cardiovascular Disease

## 2016-01-12 ENCOUNTER — Ambulatory Visit (HOSPITAL_COMMUNITY)
Admission: RE | Admit: 2016-01-12 | Discharge: 2016-01-12 | Disposition: A | Discharge status: Home / Self Care | Payer: BLUE CROSS/BLUE SHIELD | Source: Ambulatory Visit | Attending: Cardiology | Admitting: Cardiology

## 2016-01-12 ENCOUNTER — Ambulatory Visit (HOSPITAL_COMMUNITY)
Admission: RE | Admit: 2016-01-12 | Discharge: 2016-01-12 | Disposition: A | Discharge status: Home / Self Care | Payer: BLUE CROSS/BLUE SHIELD | Source: Ambulatory Visit | Attending: Cardiovascular Disease | Admitting: Cardiovascular Disease

## 2016-01-12 DIAGNOSIS — I10 Essential (primary) hypertension: Secondary | ICD-10-CM | POA: Diagnosis not present

## 2016-01-12 DIAGNOSIS — F17200 Nicotine dependence, unspecified, uncomplicated: Secondary | ICD-10-CM | POA: Insufficient documentation

## 2016-01-12 DIAGNOSIS — I714 Abdominal aortic aneurysm, without rupture, unspecified: Secondary | ICD-10-CM

## 2016-01-12 DIAGNOSIS — R1013 Epigastric pain: Secondary | ICD-10-CM

## 2016-01-12 DIAGNOSIS — Z7189 Other specified counseling: Secondary | ICD-10-CM

## 2016-01-12 DIAGNOSIS — I700 Atherosclerosis of aorta: Secondary | ICD-10-CM

## 2016-01-12 DIAGNOSIS — Z8249 Family history of ischemic heart disease and other diseases of the circulatory system: Secondary | ICD-10-CM | POA: Insufficient documentation

## 2016-01-12 DIAGNOSIS — R002 Palpitations: Secondary | ICD-10-CM | POA: Insufficient documentation

## 2016-01-12 DIAGNOSIS — R079 Chest pain, unspecified: Secondary | ICD-10-CM

## 2016-01-12 DIAGNOSIS — Z7689 Persons encountering health services in other specified circumstances: Secondary | ICD-10-CM

## 2016-01-12 DIAGNOSIS — R109 Unspecified abdominal pain: Secondary | ICD-10-CM | POA: Diagnosis not present

## 2016-01-12 LAB — MYOCARDIAL PERFUSION IMAGING
LVDIAVOL: 108 mL
LVSYSVOL: 53 mL
Peak HR: 94 {beats}/min
Rest HR: 66 {beats}/min
SDS: 2
SRS: 3
SSS: 5
TID: 1.3

## 2016-01-12 MED ORDER — TECHNETIUM TC 99M SESTAMIBI GENERIC - CARDIOLITE
31.2000 | Freq: Once | INTRAVENOUS | Status: AC | PRN
  Administered 2016-01-12: 31.2 via INTRAVENOUS

## 2016-01-12 MED ORDER — AMINOPHYLLINE 25 MG/ML IV SOLN
75.0000 mg | Freq: Once | INTRAVENOUS | Status: AC
  Administered 2016-01-12: 75 mg via INTRAVENOUS

## 2016-01-12 MED ORDER — REGADENOSON 0.4 MG/5ML IV SOLN
0.4000 mg | Freq: Once | INTRAVENOUS | Status: AC
  Administered 2016-01-12: 0.4 mg via INTRAVENOUS

## 2016-01-12 MED ORDER — TECHNETIUM TC 99M SESTAMIBI GENERIC - CARDIOLITE
9.8000 | Freq: Once | INTRAVENOUS | Status: AC | PRN
  Administered 2016-01-12: 9.8 via INTRAVENOUS

## 2016-01-16 ENCOUNTER — Encounter: Payer: Self-pay | Admitting: Internal Medicine

## 2016-01-16 ENCOUNTER — Ambulatory Visit (INDEPENDENT_AMBULATORY_CARE_PROVIDER_SITE_OTHER): Payer: BLUE CROSS/BLUE SHIELD | Admitting: Internal Medicine

## 2016-01-16 VITALS — BP 128/78 | HR 84 | Ht 67.0 in | Wt 152.6 lb

## 2016-01-16 DIAGNOSIS — R05 Cough: Secondary | ICD-10-CM | POA: Diagnosis not present

## 2016-01-16 DIAGNOSIS — R058 Other specified cough: Secondary | ICD-10-CM | POA: Insufficient documentation

## 2016-01-16 DIAGNOSIS — F17210 Nicotine dependence, cigarettes, uncomplicated: Secondary | ICD-10-CM

## 2016-01-16 MED ORDER — PANTOPRAZOLE SODIUM 40 MG PO TBEC: 40.0000 mg | DELAYED_RELEASE_TABLET | Freq: Every day | ORAL | Status: DC

## 2016-01-16 MED ORDER — FAMOTIDINE 20 MG PO TABS: ORAL_TABLET | ORAL | Status: DC

## 2016-01-16 NOTE — Patient Instructions (Signed)
Pantoprazole (protonix) 40 mg   Take  30-60 min before first meal of the day and Pepcid (famotidine)  20 mg one @  bedtime until return to office - this is the best way to tell whether stomach acid is contributing to your problem.    GERD (REFLUX)  is an extremely common cause of respiratory symptoms just like yours , many times with no obvious heartburn at all.    It can be treated with medication, but also with lifestyle changes including elevation of the head of your bed (ideally with 6 inch  bed blocks),  Smoking cessation, avoidance of late meals, excessive alcohol, and avoid fatty foods, chocolate, peppermint, colas, red wine, and acidic juices such as orange juice.  NO MINT OR MENTHOL PRODUCTS SO NO COUGH DROPS  USE SUGARLESS CANDY INSTEAD (Jolley ranchers or Stover's or Life Savers) or even ice chips will also do - the key is to swallow to prevent all throat clearing. NO OIL BASED VITAMINS - use powdered substitutes.  Please schedule a follow up office visit in 6 weeks, call sooner if needed with pfts - ok to push back

## 2016-01-16 NOTE — Assessment & Plan Note (Signed)

## 2016-01-16 NOTE — Progress Notes (Signed)
Subjective:    Patient ID: Glenda Hill, female    DOB: [birth year]      MRN: [id number]  HPI  10 yowf active smoker with tendency to cough around 2013/4 wax and wanes but esp in am referred to pulmonary  by Dr Eden Emms for worsening cough since 11/2015.   01/16/2016 1st [facility] office visit/    Chief Complaint  Patient presents with  . Pulmonary Consult    Referred by Dr Eden Emms.  Pt c/o cough for the past several months- non prod. Cough sometimes wakes her up from sleep.   cough x 3 y  is mostly dry but rattling  mostly daytime x first hour of the wakening  - much worse on lisinopril > caused her to woke up strangling  Cough worse since first 2017 still not productive - worse with perfume/ not worse with exertion and "walks 2-5 miles daily at work" so sob Cp was midline /transient with neg w/u by cards to date.  In terms of the cough/ no obvious other patterns in day to day or daytime variabilty or assoc  chest tightness, subjective wheeze overt sinus or hb symptoms. No unusual exp hx or h/o childhood pna/ asthma or knowledge of premature birth.  Sleeping ok without nocturnal  or early am exacerbation  of respiratory  c/o's or need for noct saba. Also denies any obvious fluctuation of symptoms with weather or environmental changes or other aggravating or alleviating factors except as outlined above   Current Medications, Allergies, Complete Past Medical History, Past Surgical History, Family History, and Social History were reviewed in [employer] record.     Review of Systems  Constitutional: Negative for fever, chills and unexpected weight change.  HENT: Negative for congestion, dental problem, ear pain, nosebleeds, postnasal drip, rhinorrhea, sinus pressure, sneezing, sore throat, trouble swallowing and voice change.   Eyes: Negative for visual disturbance.  Respiratory: Positive for cough. Negative for choking and shortness of breath.     Cardiovascular: Negative for chest pain and leg swelling.  Gastrointestinal: Negative for vomiting, abdominal pain and diarrhea.  Genitourinary: Negative for difficulty urinating.  Musculoskeletal: Negative for arthralgias.  Skin: Negative for rash.  Neurological: Negative for tremors, syncope and headaches.  Hematological: Does not bruise/bleed easily.       Objective:   Physical Exam amb somber wf nad  Wt Readings from Last 3 Encounters:  01/16/16 152 lb 9.6 oz (69.219 kg)  01/12/16 154 lb (69.854 kg)  12/27/15 154 lb (69.854 kg)    Vital signs reviewed   HEENT: partial dentures , turbinates, and oropharynx. Nl external ear canals without cough reflex   NECK :  without JVD/Nodes/TM/ nl carotid upstrokes bilaterally   LUNGS: no acc muscle use,  Nl contour chest which is clear to A and P bilaterally without cough on insp or exp maneuvers   CV:  RRR  no s3 or murmur or increase in P2, no edema   ABD:  soft and nontender with nl inspiratory excursion in the supine position. No bruits or organomegaly, bowel sounds nl  MS:  Nl gait/ ext warm without deformities, calf tenderness, cyanosis or clubbing No obvious joint restrictions   SKIN: warm and dry without lesions    NEURO:  alert, approp, nl sensorium with  no motor deficits     I personally reviewed images and agree with radiology impression as follows:  CXR: 12/25/15 No active cardiopulmonary disease.  Assessment & Plan:

## 2016-01-16 NOTE — Assessment & Plan Note (Addendum)
The most common causes of chronic cough in immunocompetent adults include the following: upper airway cough syndrome (UACS), previously referred to as postnasal drip syndrome (PNDS), which is caused by variety of rhinosinus conditions; (2) asthma; (3) GERD; (4) chronic bronchitis from cigarette smoking or other inhaled environmental irritants; (5) nonasthmatic eosinophilic bronchitis; and (6) bronchiectasis.   These conditions, singly or in combination, have accounted for up to 94% of the causes of chronic cough in prospective studies.   Other conditions have constituted no >6% of the causes in prospective studies These have included bronchogenic carcinoma, chronic interstitial pneumonia, sarcoidosis, left ventricular failure, ACEI-induced cough, and aspiration from a condition associated with pharyngeal dysfunction.    Chronic cough is often simultaneously caused by more than one condition. A single cause has been found from 38 to 82% of the time, multiple causes from 18 to 62%. Multiply caused cough has been the result of three diseases up to 42% of the time.       Based on hx and exam, this is most likely:  Classic Upper airway cough syndrome, so named because it's frequently impossible to sort out how much is  CR/sinusitis with freq throat clearing (which can be related to primary GERD)   vs  causing  secondary (" extra esophageal")  GERD from wide swings in gastric pressure that occur with throat clearing, often  promoting self use of mint and menthol lozenges that reduce the lower esophageal sphincter tone and exacerbate the problem further in a cyclical fashion.   These are the same pts (now being labeled as having "irritable larynx syndrome" by some cough centers) who not infrequently have a history of having failed to tolerate ace inhibitors(as is clearly the case here) ,  dry powder inhalers or biphosphonates or report having atypical reflux symptoms that don't respond to standard doses of PPI  , and are easily confused as having aecopd or asthma flares by even experienced allergists/ pulmonologists.   The first step is to maximize acid suppression and eliminate cyclical coughing with use of hard rock candy then regroup with pfts    I had an extended discussion with the patient reviewing all relevant studies completed to date and  lasting 35/60 min  1) Explained: The standardized cough guidelines published in Chest by [place] in 2006 are still the best available and consist of a multiple step process (up to 12!) , not a single office visit,  and are intended  to address this problem logically,  with an alogrithm dependent on response to empiric treatment at  each progressive step  to determine a specific diagnosis with  minimal addtional testing needed. Therefore if adherence is an issue or can't be accurately verified,  it's very unlikely the standard evaluation and treatment will be successful here.    Furthermore, response to therapy (other than acute cough suppression, which should only be used short term with avoidance of narcotic containing cough syrups if possible), can be a gradual process for which the patient may perceive immediate benefit.  Unlike going to an eye doctor where the best perscription is almost always the first one and is immediately effective, this is almost never the case in the management of chronic cough syndromes. Therefore the patient needs to commit up front to consistently adhere to recommendations  for up to 6 weeks of therapy directed at the likely underlying problem(s) before the response can be reasonably evaluated.     2) Each maintenance medication was reviewed in detail  including most importantly the difference between maintenance and prns and under what circumstances the prns are to be triggered using an action plan format that is not reflected in the computer generated alphabetically organized AVS.    Please see instructions for details which  were reviewed in writing and the patient given a copy highlighting the part that I personally wrote and discussed at today's ov.   See instructions for specific recommendations which were reviewed directly with the patient who was given a copy with highlighter outlining the key components.

## 2016-01-29 NOTE — Progress Notes (Signed)
Patient ID: Glenda Hill, female   DOB: 1956/02/05, 60 y.o.   MRN: [id number]     Cardiology Office Note   Date:  01/31/2016   [name], DOB 09/30/1956, [name]  PCP:  No PCP Per Patient  Cardiologist:   Charlton Haws, MD   Chief Complaint  Patient presents with  . Follow-up    no sx      History of Present Illness: 60 y.o.  f w PMH htn who was seen in ER 12/25/15 with chest pain.  At work  when she began having central chest pain that radiated to her back and L arm. She describes the pain as burning. The pain was constant overnight and persisted in ER visit.  Patient didn't  taken anything for the pain at home. No sob, no cough. No fever/chills. She r/o had no acute ECG changes and negative tropinin .  CRF;  HTN   CXR NAD no CE and normal mediastinum reviewed.   She has over 50 py history smoking and clinical COPD.  No primary  Has not been on BP pills Did not think norvasc effective in past Describes cough with  Lisinopril.    Pain started after lifting heavy items at work. 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  Not pleuritic  Piercing stabbing pain including epigastric area    Started on ARB for HTN 12/27/15 Referred to pulmonary  Dr Sherene Sires started her on protonix   F/U myovue reviewed 01/12/16  Normal no ischemia EF 51%  Duplex no AAA   Past Medical History  Diagnosis Date  . Hypertension     Past Surgical History  Procedure Laterality Date  . Cesarean section    . Back surgery       Current Outpatient Prescriptions  Medication Sig Dispense Refill  . famotidine (PEPCID) 20 MG tablet Take 20 mg by mouth daily.    Marland Kitchen ibuprofen (ADVIL,MOTRIN) 200 MG tablet Take 400 mg by mouth every 6 (six) hours as needed for headache (pain).    Marland Kitchen losartan-hydrochlorothiazide (HYZAAR) 100-12.5 MG tablet Take 1 tablet by mouth daily. 90 tablet 3  . Multiple Vitamin (MULTIVITAMIN WITH MINERALS) TABS tablet Take 1  tablet by mouth daily.    . pantoprazole (PROTONIX) 40 MG tablet Take 1 tablet (40 mg total) by mouth daily. Take 30-60 min before first meal of the day 30 tablet 2  . traMADol (ULTRAM) 50 MG tablet Take 1 tablet (50 mg total) by mouth every 6 (six) hours as needed for moderate pain. 120 tablet 1   No current facility-administered medications for this visit.    Allergies:   Review of patient's allergies indicates no known allergies.    Social History:  The patient  reports that she has been smoking Cigarettes.  She has a 4 pack-year smoking history. She has never used smokeless tobacco. She reports that she drinks alcohol. She reports that she does not use illicit drugs.   Family History:  The patient's family history includes Emphysema in her mother; Heart disease in her mother; Lung cancer in her mother; Ovarian cancer in her sister.    ROS:  Please see the history of present illness.   Otherwise, review of systems are positive for none.   All other systems are reviewed and negative.    PHYSICAL EXAM: VS:  BP 130/72 mmHg  Pulse 69  Ht  (1.702 m)  Wt 69.31  kg (152 lb 12.8 oz)  BMI 23.93 kg/m2  SpO2 95% , BMI Body mass index is 23.93 kg/(m^2). Affect appropriate Middle aged female with COPD HEENT: normal Neck supple with no adenopathy JVP normal no bruits no thyromegaly Lungs clear with exp wheezing and good diaphragmatic motion Heart:  S1/S2 no murmur, no rub, gallop or click PMI normal Abdomen: benighn, BS positve, no tenderness,  Abdominal Ao palpable and mildly tender  no bruit.  No HSM or HJR Distal pulses intact with no bruits No edema Neuro non-focal Skin warm and dry No muscular weakness    EKG:  12/25/15  NSR rate 82 LVH no acute ST/T wave changes no change from June 2013    Recent Labs: 12/25/2015: BUN 10; Creatinine, Ser 0.71; Hemoglobin 14.5; Platelets 290; Potassium 3.8; Sodium 140    Lipid Panel No results found for: CHOL, TRIG, HDL, CHOLHDL, VLDL,  LDLCALC, LDLDIRECT    Wt Readings from Last 3 Encounters:  01/31/16 69.31 kg (152 lb 12.8 oz)  01/16/16 69.219 kg (152 lb 9.6 oz)  01/12/16 69.854 kg (154 lb)      Other studies Reviewed: Additional studies/ records that were reviewed today include: ER notes, labs , ECG and CXR see HPI.    ASSESSMENT AND PLAN:  1.  Chest Pain normal myovue no evidence of CAD 2. HTN: better on Hyzaar 3. COPD:  Discussed smoking cessation Still " doesn't have will to quit"  F/u Dr Sherene SiresWert on antireflux measures  Her mother died from lung cancer 4. Palpable Ao with epigastric pain duplex with no AAA   Current medicines are reviewed at length with the patient today.  The patient does not have concerns regarding medicines.  The following changes have been made:     Labs/ tests ordered today include:   No orders of the defined types were placed in this encounter.     Disposition:   FU with me  PRN     Signed, Charlton HawsPeter , MD  01/31/2016 3:48 PM    [facility] [address], [place], [place]  [address] Phone: [phone]; Fax: [phone]

## 2016-01-31 ENCOUNTER — Ambulatory Visit (INDEPENDENT_AMBULATORY_CARE_PROVIDER_SITE_OTHER): Payer: BLUE CROSS/BLUE SHIELD | Admitting: Cardiovascular Disease

## 2016-01-31 ENCOUNTER — Encounter: Payer: Self-pay | Admitting: Cardiovascular Disease

## 2016-01-31 VITALS — BP 130/72 | HR 69 | Ht 67.0 in | Wt 152.8 lb

## 2016-01-31 DIAGNOSIS — Z09 Encounter for follow-up examination after completed treatment for conditions other than malignant neoplasm: Secondary | ICD-10-CM

## 2016-01-31 NOTE — Patient Instructions (Addendum)
Medication Instructions:  Your physician recommends that you continue on your current medications as directed. Please refer to the Current Medication list given to you today.  Labwork: NONE  Testing/Procedures: NONE  Follow-Up: Your physician wants you to follow-up as needed.  If you need a refill on your cardiac medications before your next appointment, please call your pharmacy.   

## 2016-03-20 ENCOUNTER — Ambulatory Visit: Payer: BLUE CROSS/BLUE SHIELD | Admitting: Internal Medicine

## 2016-12-28 ENCOUNTER — Other Ambulatory Visit: Payer: Self-pay | Admitting: *Deleted

## 2016-12-28 NOTE — Telephone Encounter (Signed)
Should this be deferred to pcp as patient is prn? Please advise. Thanks, MI

## 2016-12-30 IMAGING — DX DG CHEST 2V
2 series · 2 of 2 positions shown · non-contrast
Comparison: Prior radiograph from 12/29/2014.

CLINICAL DATA: Initial evaluation for acute centralized chest pain
radiating to back.

EXAM:
CHEST  2 VIEW

[chest pa]
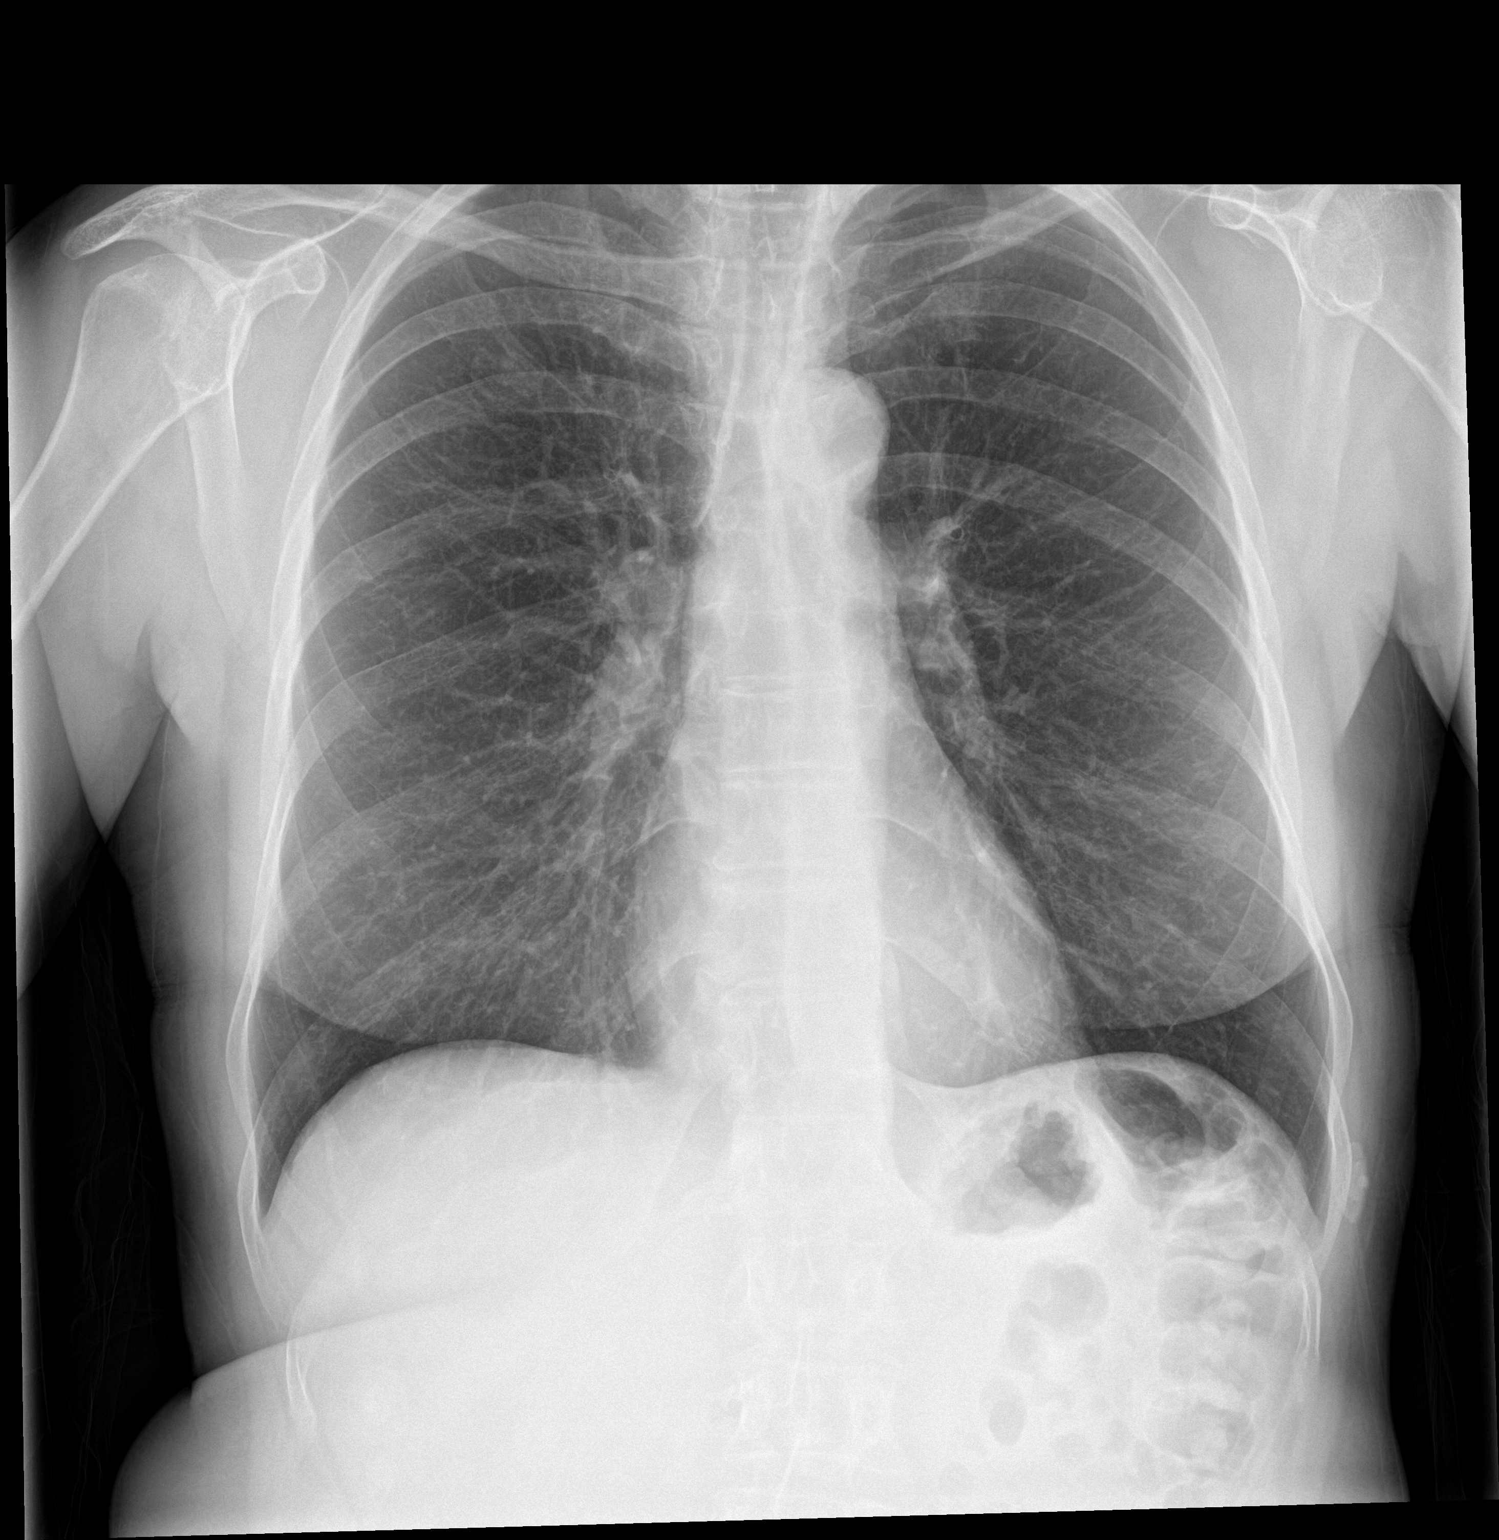

[chest lat]
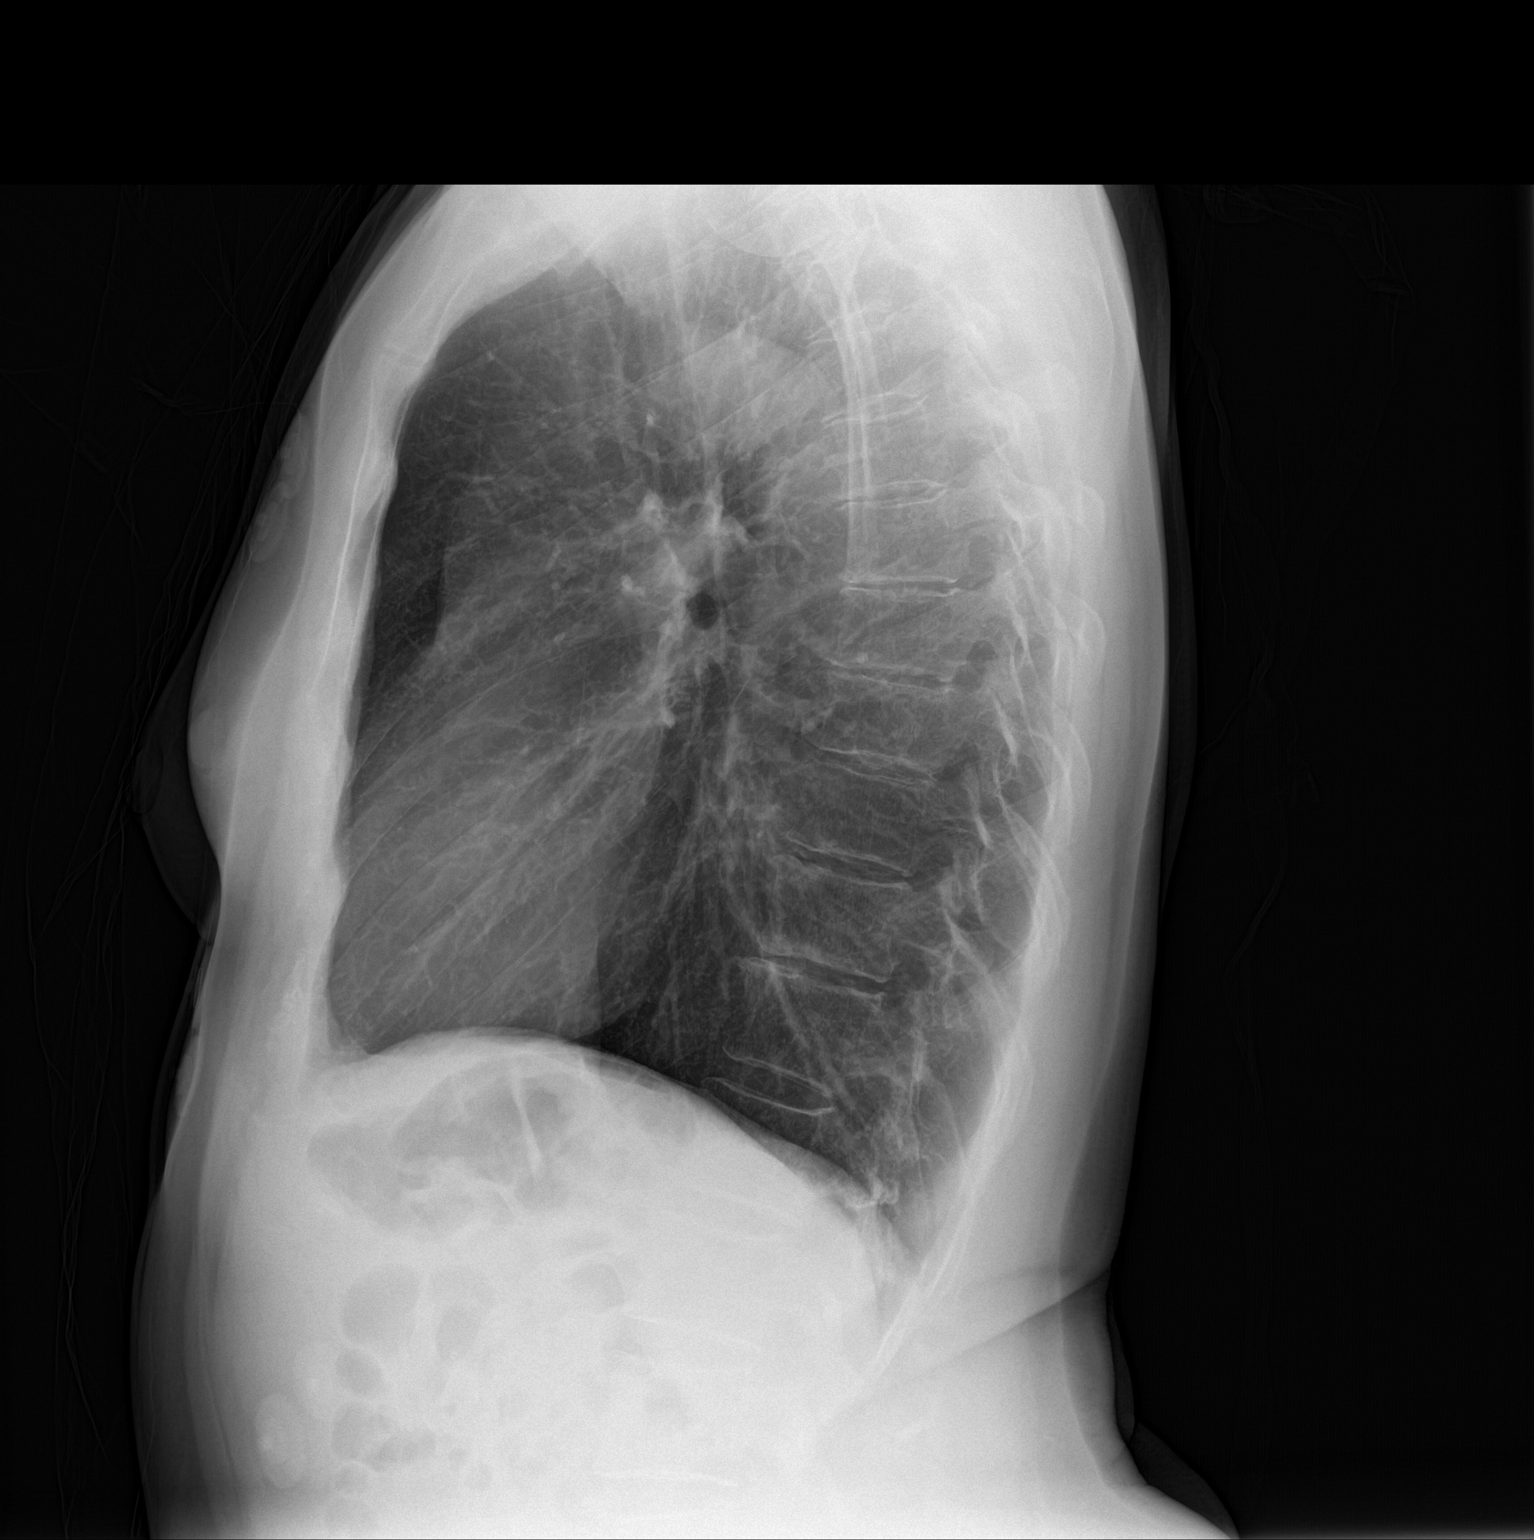

[2 of 2 positions shown; findings below may reference images not displayed]

FINDINGS: The cardiac and mediastinal silhouettes are stable in size and
contour, and remain within normal limits. Atheromatous plaque within
the aortic arch noted.

The lungs are normally inflated. No airspace consolidation, pleural
effusion, or pulmonary edema is identified. There is no
pneumothorax.

No acute osseous abnormality identified.
IMPRESSION: No active cardiopulmonary disease.

## 2016-12-30 MED ORDER — LOSARTAN POTASSIUM-HCTZ 100-12.5 MG PO TABS: 1.0000 | ORAL_TABLET | Freq: Every day | ORAL | 3 refills | Status: DC

## 2017-01-17 IMAGING — NM NM MISC PROCEDURE
6 series · 36 of 36 positions shown · non-contrast
Comparison: none

[Series 1: wbr rest · 6.40mm/px · 6 of 64 frames shown]
[frame 6/64]
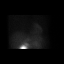
[frame 16/64]
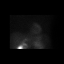
[frame 27/64]
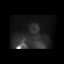
[frame 38/64]
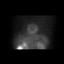
[frame 48/64]
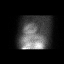
[frame 59/64]
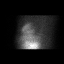

[Series 1: wbr_r-proj_st wbr rest · 6.40mm/px · 6 of 64 frames shown]
[frame 6/64]
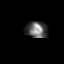
[frame 16/64]
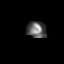
[frame 27/64]
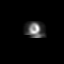
[frame 38/64]
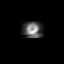
[frame 48/64]
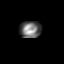
[frame 59/64]
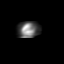

[Series 2: wbr_s-proj_st wbr stress-gsp · 6.40mm/px · 6 of 512 frames shown]
[frame 43/512]
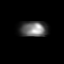
[frame 128/512]
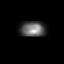
[frame 214/512]
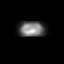
[frame 299/512]
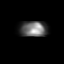
[frame 384/512]
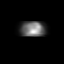
[frame 470/512]
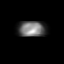

[Series 2: wbr stress-gsp · 6.40mm/px · 6 of 512 frames shown]
[frame 43/512]
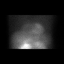
[frame 128/512]
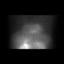
[frame 214/512]
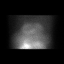
[frame 299/512]
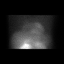
[frame 384/512]
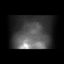
[frame 470/512]
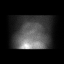

[Series 3: wbr_s-proj_st wbr stress-sum-em · 6.40mm/px · 6 of 64 frames shown]
[frame 6/64]
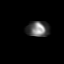
[frame 16/64]
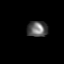
[frame 27/64]
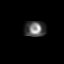
[frame 38/64]
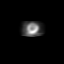
[frame 48/64]
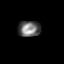
[frame 59/64]
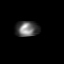

[Series 3: wbr stress-sum-em · 6.40mm/px · 6 of 64 frames shown]
[frame 6/64]
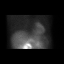
[frame 16/64]
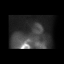
[frame 27/64]
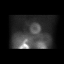
[frame 38/64]
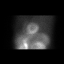
[frame 48/64]
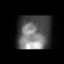
[frame 59/64]
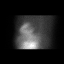

[36 of 36 positions shown; findings below may reference images not displayed]

Canned report from images found in remote index.

Refer to host system for actual result text.

## 2017-04-26 ENCOUNTER — Other Ambulatory Visit: Payer: Self-pay | Admitting: Neurosurgery

## 2017-05-17 ENCOUNTER — Encounter (HOSPITAL_COMMUNITY)
Admission: RE | Admit: 2017-05-17 | Discharge: 2017-05-17 | Disposition: A | Discharge status: Home / Self Care | Payer: Worker's Compensation | Source: Ambulatory Visit | Attending: Neurosurgery | Admitting: Neurosurgery

## 2017-05-17 ENCOUNTER — Encounter (HOSPITAL_COMMUNITY): Payer: Self-pay

## 2017-05-17 DIAGNOSIS — Z01810 Encounter for preprocedural cardiovascular examination: Secondary | ICD-10-CM | POA: Insufficient documentation

## 2017-05-17 DIAGNOSIS — M48062 Spinal stenosis, lumbar region with neurogenic claudication: Secondary | ICD-10-CM | POA: Insufficient documentation

## 2017-05-17 DIAGNOSIS — Z01812 Encounter for preprocedural laboratory examination: Secondary | ICD-10-CM | POA: Diagnosis not present

## 2017-05-17 HISTORY — DX: Adverse effect of unspecified anesthetic, initial encounter: T41.45XA

## 2017-05-17 HISTORY — DX: Other complications of anesthesia, initial encounter: T88.59XA

## 2017-05-17 LAB — CBC
HEMATOCRIT: 45.4 % (ref 36.0–46.0)
Hemoglobin: 15.5 g/dL — ABNORMAL HIGH (ref 12.0–15.0)
MCH: 31.9 pg (ref 26.0–34.0)
MCHC: 34.1 g/dL (ref 30.0–36.0)
MCV: 93.4 fL (ref 78.0–100.0)
PLATELETS: 274 10*3/uL (ref 150–400)
RBC: 4.86 MIL/uL (ref 3.87–5.11)
RDW: 13.5 % (ref 11.5–15.5)
WBC: 5.7 10*3/uL (ref 4.0–10.5)

## 2017-05-17 LAB — SURGICAL PCR SCREEN
MRSA, PCR: NEGATIVE
Staphylococcus aureus: NEGATIVE

## 2017-05-17 LAB — BASIC METABOLIC PANEL
Anion gap: 9 (ref 5–15)
BUN: 8 mg/dL (ref 6–20)
CHLORIDE: 98 mmol/L — AB (ref 101–111)
CO2: 27 mmol/L (ref 22–32)
CREATININE: 0.79 mg/dL (ref 0.44–1.00)
Calcium: 9.7 mg/dL (ref 8.9–10.3)
GFR calc Af Amer: >60 mL/min (ref >60)
GFR calc non Af Amer: >60 mL/min (ref >60)
GLUCOSE: 118 mg/dL — AB (ref 65–99)
Potassium: 3.9 mmol/L (ref 3.5–5.1)
Sodium: 134 mmol/L — ABNORMAL LOW (ref 135–145)

## 2017-05-17 LAB — TYPE AND SCREEN
ABO/RH(D): A POS
Antibody Screen: NEGATIVE

## 2017-05-17 LAB — ABO/RH: ABO/RH(D): A POS

## 2017-05-17 MED ORDER — CHLORHEXIDINE GLUCONATE CLOTH 2 % EX PADS: 6.0000 | MEDICATED_PAD | Freq: Once | CUTANEOUS | Status: DC

## 2017-05-17 NOTE — Progress Notes (Signed)
PCP - none Cardiologist - denies cardiologist, saw Dr. Eden EmmsNishan for chest pain in March 2017. Pt denies any chest pain since then    EKG - 05/17/2017  Stress Test - 01/12/16   Patient denies shortness of breath, fever, cough and chest pain at PAT appointment   Patient verbalized understanding of instructions that were given to them at the PAT appointment. Patient was also instructed that they will need to review over the PAT instructions again at home before surgery.

## 2017-05-17 NOTE — Pre-Procedure Instructions (Signed)
Glenda Hill  05/17/2017      [facility] [address] - [place], [place] - [address] [place] [address] Phone: [phone] Fax: [phone]  [facility] [address] - [place], [place] - [address] [place] [address] Phone: [phone] Fax: [phone]    Your procedure is scheduled on Monday July 9.  [facility] Admitting at 5:30 A.M.  Call this number if you have problems the morning of surgery:  [phone]   Remember:  Do not eat food or drink liquids after midnight.  Take these medicines the morning of surgery with A SIP OF WATER: NONE  7 days prior to surgery STOP taking any Aspirin, Aleve, Naproxen, Ibuprofen, Motrin, Advil, Goody's, BC's, all herbal medications, fish oil, and all vitamins    Do not wear jewelry, make-up or nail polish.  Do not wear lotions, powders, or perfumes, or deoderant.  Do not shave 48 hours prior to surgery.  Men may shave face and neck.  Do not bring valuables to the hospital.  [facility] is not responsible for any belongings or valuables.  Contacts, dentures or bridgework may not be worn into surgery.  Leave your suitcase in the car.  After surgery it may be brought to your room.  For patients admitted to the hospital, discharge time will be determined by your treatment team.  Patients discharged the day of surgery will not be allowed to drive home.   Special instructions:    [facility]- Preparing For Surgery  Before surgery, you can play an important role. Because skin is not sterile, your skin needs to be as free of germs as possible. You can reduce the number of germs on your skin by washing with CHG (chlorahexidine gluconate) Soap before surgery.  CHG is an antiseptic cleaner which kills germs and bonds with the skin to continue killing germs even after washing.  Please do not use if you have an allergy to CHG or antibacterial soaps. If your skin becomes  reddened/irritated stop using the CHG.  Do not shave (including legs and underarms) for at least 48 hours prior to first CHG shower. It is OK to shave your face.  Please follow these instructions carefully.   1. Shower the NIGHT BEFORE SURGERY and the MORNING OF SURGERY with CHG.   2. If you chose to wash your hair, wash your hair first as usual with your normal shampoo.  3. After you shampoo, rinse your hair and body thoroughly to remove the shampoo.  4. Use CHG as you would any other liquid soap. You can apply CHG directly to the skin and wash gently with a scrungie or a clean washcloth.   5. Apply the CHG Soap to your body ONLY FROM THE NECK DOWN.  Do not use on open wounds or open sores. Avoid contact with your eyes, ears, mouth and genitals (private parts). Wash genitals (private parts) with your normal soap.  6. Wash thoroughly, paying special attention to the area where your surgery will be performed.  7. Thoroughly rinse your body with warm water from the neck down.  8. DO NOT shower/wash with your normal soap after using and rinsing off the CHG Soap.  9. Pat yourself dry with a CLEAN TOWEL.   10. Wear CLEAN PAJAMAS   11. Place CLEAN SHEETS on your bed the night of your first shower and DO NOT SLEEP WITH PETS.  Day of Surgery: Do not apply any deodorants/lotions. Please wear clean clothes to the hospital/surgery center.      Please read over the following fact sheets that you were given. MRSA Information

## 2017-05-27 ENCOUNTER — Inpatient Hospital Stay (HOSPITAL_COMMUNITY)
Admission: RE | Disposition: A | Discharge status: Home / Self Care | Payer: Self-pay | Source: Ambulatory Visit | Attending: Neurosurgery

## 2017-05-27 ENCOUNTER — Inpatient Hospital Stay (HOSPITAL_COMMUNITY)
Admission: RE | Admit: 2017-05-27 | Discharge: 2017-05-28 | DRG: 455 | Disposition: A | Discharge status: Home / Self Care | Payer: Worker's Compensation | Source: Ambulatory Visit | Attending: Neurosurgery | Admitting: Neurosurgery

## 2017-05-27 ENCOUNTER — Inpatient Hospital Stay (HOSPITAL_COMMUNITY): Payer: Worker's Compensation

## 2017-05-27 ENCOUNTER — Inpatient Hospital Stay (HOSPITAL_COMMUNITY): Payer: Worker's Compensation | Admitting: Anesthesiology

## 2017-05-27 ENCOUNTER — Encounter (HOSPITAL_COMMUNITY): Payer: Self-pay | Admitting: *Deleted

## 2017-05-27 DIAGNOSIS — M4317 Spondylolisthesis, lumbosacral region: Secondary | ICD-10-CM | POA: Diagnosis present

## 2017-05-27 DIAGNOSIS — M4807 Spinal stenosis, lumbosacral region: Secondary | ICD-10-CM | POA: Diagnosis present

## 2017-05-27 DIAGNOSIS — F17210 Nicotine dependence, cigarettes, uncomplicated: Secondary | ICD-10-CM | POA: Diagnosis present

## 2017-05-27 DIAGNOSIS — M5116 Intervertebral disc disorders with radiculopathy, lumbar region: Secondary | ICD-10-CM | POA: Diagnosis present

## 2017-05-27 DIAGNOSIS — M5117 Intervertebral disc disorders with radiculopathy, lumbosacral region: Secondary | ICD-10-CM | POA: Diagnosis present

## 2017-05-27 DIAGNOSIS — Z79899 Other long term (current) drug therapy: Secondary | ICD-10-CM | POA: Diagnosis not present

## 2017-05-27 DIAGNOSIS — M48062 Spinal stenosis, lumbar region with neurogenic claudication: Secondary | ICD-10-CM | POA: Diagnosis present

## 2017-05-27 DIAGNOSIS — I10 Essential (primary) hypertension: Secondary | ICD-10-CM | POA: Diagnosis present

## 2017-05-27 DIAGNOSIS — Z419 Encounter for procedure for purposes other than remedying health state, unspecified: Secondary | ICD-10-CM

## 2017-05-27 DIAGNOSIS — M4726 Other spondylosis with radiculopathy, lumbar region: Secondary | ICD-10-CM | POA: Diagnosis present

## 2017-05-27 DIAGNOSIS — M48061 Spinal stenosis, lumbar region without neurogenic claudication: Secondary | ICD-10-CM | POA: Diagnosis present

## 2017-05-27 SURGERY — POSTERIOR LUMBAR FUSION 1 LEVEL
Anesthesia: General | Site: Spine Lumbar

## 2017-05-27 MED ORDER — FLEET ENEMA 7-19 GM/118ML RE ENEM: 1.0000 | ENEMA | Freq: Once | RECTAL | Status: DC | PRN

## 2017-05-27 MED ORDER — THROMBIN 5000 UNITS EX SOLR
CUTANEOUS | Status: AC
  Filled 2017-05-27: qty 5000

## 2017-05-27 MED ORDER — CEFAZOLIN SODIUM 1 G IJ SOLR
INTRAMUSCULAR | Status: AC
  Filled 2017-05-27: qty 20

## 2017-05-27 MED ORDER — LIDOCAINE-EPINEPHRINE 1 %-1:100000 IJ SOLN
INTRAMUSCULAR | Status: AC
  Filled 2017-05-27: qty 1

## 2017-05-27 MED ORDER — ARTIFICIAL TEARS OPHTHALMIC OINT
TOPICAL_OINTMENT | OPHTHALMIC | Status: DC | PRN
  Administered 2017-05-27: 1 via OPHTHALMIC

## 2017-05-27 MED ORDER — DEXAMETHASONE SODIUM PHOSPHATE 10 MG/ML IJ SOLN
INTRAMUSCULAR | Status: AC
  Filled 2017-05-27: qty 1

## 2017-05-27 MED ORDER — MORPHINE SULFATE (PF) 4 MG/ML IV SOLN: 4.0000 mg | INTRAVENOUS | Status: DC | PRN

## 2017-05-27 MED ORDER — HYDROMORPHONE HCL 1 MG/ML IJ SOLN
INTRAMUSCULAR | Status: AC
  Filled 2017-05-27: qty 0.5

## 2017-05-27 MED ORDER — SUGAMMADEX SODIUM 200 MG/2ML IV SOLN
INTRAVENOUS | Status: DC | PRN
  Administered 2017-05-27: 140 mg via INTRAVENOUS

## 2017-05-27 MED ORDER — SODIUM CHLORIDE 0.9% FLUSH
3.0000 mL | Freq: Two times a day (BID) | INTRAVENOUS | Status: DC
  Administered 2017-05-27 (×2): 3 mL via INTRAVENOUS

## 2017-05-27 MED ORDER — MIDAZOLAM HCL 2 MG/2ML IJ SOLN
INTRAMUSCULAR | Status: AC
  Filled 2017-05-27: qty 2

## 2017-05-27 MED ORDER — ROCURONIUM BROMIDE 50 MG/5ML IV SOLN
INTRAVENOUS | Status: AC
  Filled 2017-05-27: qty 2

## 2017-05-27 MED ORDER — PROMETHAZINE HCL 25 MG/ML IJ SOLN: 6.2500 mg | INTRAMUSCULAR | Status: DC | PRN

## 2017-05-27 MED ORDER — FENTANYL CITRATE (PF) 100 MCG/2ML IJ SOLN
INTRAMUSCULAR | Status: DC | PRN
  Administered 2017-05-27 (×7): 50 ug via INTRAVENOUS
  Administered 2017-05-27: 100 ug via INTRAVENOUS
  Administered 2017-05-27: 50 ug via INTRAVENOUS

## 2017-05-27 MED ORDER — ROCURONIUM BROMIDE 50 MG/5ML IV SOLN
INTRAVENOUS | Status: AC
  Filled 2017-05-27: qty 1

## 2017-05-27 MED ORDER — MAGNESIUM HYDROXIDE 400 MG/5ML PO SUSP: 30.0000 mL | Freq: Every day | ORAL | Status: DC | PRN

## 2017-05-27 MED ORDER — ROCURONIUM BROMIDE 100 MG/10ML IV SOLN
INTRAVENOUS | Status: DC | PRN
  Administered 2017-05-27: 50 mg via INTRAVENOUS
  Administered 2017-05-27: 30 mg via INTRAVENOUS
  Administered 2017-05-27: 20 mg via INTRAVENOUS

## 2017-05-27 MED ORDER — BISACODYL 10 MG RE SUPP: 10.0000 mg | Freq: Every day | RECTAL | Status: DC | PRN

## 2017-05-27 MED ORDER — HYDROCHLOROTHIAZIDE 12.5 MG PO CAPS
12.5000 mg | ORAL_CAPSULE | Freq: Every day | ORAL | Status: DC
  Administered 2017-05-28: 12.5 mg via ORAL
  Filled 2017-05-27 (×2): qty 1

## 2017-05-27 MED ORDER — KETOROLAC TROMETHAMINE 30 MG/ML IJ SOLN: 30.0000 mg | Freq: Once | INTRAMUSCULAR | Status: DC

## 2017-05-27 MED ORDER — HYDROCODONE-ACETAMINOPHEN 5-325 MG PO TABS
1.0000 | ORAL_TABLET | ORAL | Status: DC | PRN
  Administered 2017-05-27: 2 via ORAL
  Administered 2017-05-27 (×2): 1 via ORAL
  Administered 2017-05-28: 2 via ORAL
  Administered 2017-05-28: 1 via ORAL
  Administered 2017-05-28: 2 via ORAL
  Filled 2017-05-27 (×2): qty 2
  Filled 2017-05-27 (×2): qty 1
  Filled 2017-05-27: qty 2
  Filled 2017-05-27: qty 1

## 2017-05-27 MED ORDER — HYDROXYZINE HCL 25 MG PO TABS: 50.0000 mg | ORAL_TABLET | ORAL | Status: DC | PRN

## 2017-05-27 MED ORDER — BUPIVACAINE HCL (PF) 0.5 % IJ SOLN
INTRAMUSCULAR | Status: DC | PRN
  Administered 2017-05-27: 10 mL

## 2017-05-27 MED ORDER — PROPOFOL 10 MG/ML IV BOLUS
INTRAVENOUS | Status: DC | PRN
  Administered 2017-05-27: 160 mg via INTRAVENOUS

## 2017-05-27 MED ORDER — SODIUM CHLORIDE 0.9% FLUSH
3.0000 mL | INTRAVENOUS | Status: DC | PRN
Start: 2017-05-27 — End: 2017-05-28

## 2017-05-27 MED ORDER — CYCLOBENZAPRINE HCL 5 MG PO TABS
5.0000 mg | ORAL_TABLET | Freq: Three times a day (TID) | ORAL | Status: DC | PRN
  Administered 2017-05-27: 5 mg via ORAL
  Filled 2017-05-27: qty 1

## 2017-05-27 MED ORDER — CEFAZOLIN SODIUM-DEXTROSE 2-4 GM/100ML-% IV SOLN
2.0000 g | INTRAVENOUS | Status: AC
  Administered 2017-05-27 (×2): 2 g via INTRAVENOUS
  Filled 2017-05-27: qty 100

## 2017-05-27 MED ORDER — HYDROMORPHONE HCL 1 MG/ML IJ SOLN
0.2500 mg | INTRAMUSCULAR | Status: DC | PRN
  Administered 2017-05-27: 0.5 mg via INTRAVENOUS

## 2017-05-27 MED ORDER — HYDROMORPHONE HCL 1 MG/ML IJ SOLN
INTRAMUSCULAR | Status: DC | PRN
  Administered 2017-05-27 (×2): .5 mg via INTRAVENOUS

## 2017-05-27 MED ORDER — SODIUM CHLORIDE 0.9 % IJ SOLN
INTRAMUSCULAR | Status: AC
  Filled 2017-05-27: qty 10

## 2017-05-27 MED ORDER — LIDOCAINE HCL (CARDIAC) 20 MG/ML IV SOLN
INTRAVENOUS | Status: DC | PRN
  Administered 2017-05-27: 100 mg via INTRAVENOUS

## 2017-05-27 MED ORDER — ONDANSETRON HCL 4 MG/2ML IJ SOLN
INTRAMUSCULAR | Status: AC
  Filled 2017-05-27: qty 2

## 2017-05-27 MED ORDER — SODIUM CHLORIDE 0.9 % IR SOLN
Status: DC | PRN
  Administered 2017-05-27: 500 mL

## 2017-05-27 MED ORDER — LACTATED RINGERS IV SOLN
INTRAVENOUS | Status: DC | PRN
  Administered 2017-05-27 (×2): via INTRAVENOUS

## 2017-05-27 MED ORDER — ALUM & MAG HYDROXIDE-SIMETH 200-200-20 MG/5ML PO SUSP: 30.0000 mL | Freq: Four times a day (QID) | ORAL | Status: DC | PRN

## 2017-05-27 MED ORDER — FENTANYL CITRATE (PF) 250 MCG/5ML IJ SOLN
INTRAMUSCULAR | Status: AC
  Filled 2017-05-27: qty 5

## 2017-05-27 MED ORDER — 0.9 % SODIUM CHLORIDE (POUR BTL) OPTIME
TOPICAL | Status: DC | PRN
  Administered 2017-05-27: 1000 mL

## 2017-05-27 MED ORDER — HYDROMORPHONE HCL 1 MG/ML IJ SOLN
INTRAMUSCULAR | Status: AC
  Filled 2017-05-27: qty 1

## 2017-05-27 MED ORDER — ONDANSETRON HCL 4 MG PO TABS: 4.0000 mg | ORAL_TABLET | Freq: Four times a day (QID) | ORAL | Status: DC | PRN

## 2017-05-27 MED ORDER — ONDANSETRON HCL 4 MG/2ML IJ SOLN: 4.0000 mg | Freq: Four times a day (QID) | INTRAMUSCULAR | Status: DC | PRN

## 2017-05-27 MED ORDER — LABETALOL HCL 5 MG/ML IV SOLN
INTRAVENOUS | Status: DC | PRN
  Administered 2017-05-27: 10 mg via INTRAVENOUS

## 2017-05-27 MED ORDER — THROMBIN 20000 UNITS EX SOLR
CUTANEOUS | Status: AC
  Filled 2017-05-27: qty 20000

## 2017-05-27 MED ORDER — LOSARTAN POTASSIUM 50 MG PO TABS
100.0000 mg | ORAL_TABLET | Freq: Every day | ORAL | Status: DC
  Filled 2017-05-27 (×2): qty 2

## 2017-05-27 MED ORDER — SUCCINYLCHOLINE CHLORIDE 200 MG/10ML IV SOSY
PREFILLED_SYRINGE | INTRAVENOUS | Status: AC
  Filled 2017-05-27: qty 10

## 2017-05-27 MED ORDER — PHENYLEPHRINE HCL 10 MG/ML IJ SOLN
INTRAMUSCULAR | Status: DC | PRN
  Administered 2017-05-27: 80 ug via INTRAVENOUS
  Administered 2017-05-27 (×2): 120 ug via INTRAVENOUS
  Administered 2017-05-27: 80 ug via INTRAVENOUS

## 2017-05-27 MED ORDER — PHENOL 1.4 % MT LIQD: 1.0000 | OROMUCOSAL | Status: DC | PRN

## 2017-05-27 MED ORDER — SUGAMMADEX SODIUM 200 MG/2ML IV SOLN
INTRAVENOUS | Status: AC
  Filled 2017-05-27: qty 2

## 2017-05-27 MED ORDER — VITAMIN D 1000 UNITS PO TABS
2000.0000 [IU] | ORAL_TABLET | Freq: Every day | ORAL | Status: DC
  Administered 2017-05-27 – 2017-05-28 (×2): 2000 [IU] via ORAL
  Filled 2017-05-27 (×4): qty 2

## 2017-05-27 MED ORDER — ONDANSETRON HCL 4 MG/2ML IJ SOLN
INTRAMUSCULAR | Status: DC | PRN
  Administered 2017-05-27: 4 mg via INTRAVENOUS

## 2017-05-27 MED ORDER — BUPIVACAINE HCL (PF) 0.5 % IJ SOLN
INTRAMUSCULAR | Status: AC
  Filled 2017-05-27: qty 30

## 2017-05-27 MED ORDER — MIDAZOLAM HCL 5 MG/5ML IJ SOLN
INTRAMUSCULAR | Status: DC | PRN
  Administered 2017-05-27: 2 mg via INTRAVENOUS

## 2017-05-27 MED ORDER — KETOROLAC TROMETHAMINE 30 MG/ML IJ SOLN
INTRAMUSCULAR | Status: AC
Start: 2017-05-27 — End: 2017-05-27
  Administered 2017-05-27: 30 mg
  Filled 2017-05-27: qty 1

## 2017-05-27 MED ORDER — MENTHOL 3 MG MT LOZG: 1.0000 | LOZENGE | OROMUCOSAL | Status: DC | PRN

## 2017-05-27 MED ORDER — PHENYLEPHRINE 40 MCG/ML (10ML) SYRINGE FOR IV PUSH (FOR BLOOD PRESSURE SUPPORT)
PREFILLED_SYRINGE | INTRAVENOUS | Status: AC
  Filled 2017-05-27: qty 10

## 2017-05-27 MED ORDER — KETOROLAC TROMETHAMINE 30 MG/ML IJ SOLN
30.0000 mg | Freq: Four times a day (QID) | INTRAMUSCULAR | Status: DC
  Administered 2017-05-27 – 2017-05-28 (×3): 30 mg via INTRAVENOUS
  Filled 2017-05-27 (×3): qty 1

## 2017-05-27 MED ORDER — LOSARTAN POTASSIUM-HCTZ 100-12.5 MG PO TABS: 1.0000 | ORAL_TABLET | Freq: Every day | ORAL | Status: DC

## 2017-05-27 MED ORDER — THROMBIN 20000 UNITS EX SOLR
CUTANEOUS | Status: DC | PRN
  Administered 2017-05-27: 20 mL via TOPICAL

## 2017-05-27 MED ORDER — ACETAMINOPHEN 325 MG PO TABS: 650.0000 mg | ORAL_TABLET | ORAL | Status: DC | PRN

## 2017-05-27 MED ORDER — LIDOCAINE-EPINEPHRINE 1 %-1:100000 IJ SOLN
INTRAMUSCULAR | Status: DC | PRN
  Administered 2017-05-27: 10 mL

## 2017-05-27 MED ORDER — GELATIN ABSORBABLE MT POWD
OROMUCOSAL | Status: DC | PRN
  Administered 2017-05-27: 5 mL via TOPICAL

## 2017-05-27 MED ORDER — LIDOCAINE HCL (CARDIAC) 20 MG/ML IV SOLN
INTRAVENOUS | Status: AC
  Filled 2017-05-27: qty 5

## 2017-05-27 MED ORDER — HYDROXYZINE HCL 50 MG/ML IM SOLN: 50.0000 mg | INTRAMUSCULAR | Status: DC | PRN

## 2017-05-27 MED ORDER — EPHEDRINE 5 MG/ML INJ
INTRAVENOUS | Status: AC
  Filled 2017-05-27: qty 10

## 2017-05-27 MED ORDER — POTASSIUM CHLORIDE IN NACL 40-0.9 MEQ/L-% IV SOLN
INTRAVENOUS | Status: DC
  Filled 2017-05-27: qty 1000

## 2017-05-27 MED ORDER — ACETAMINOPHEN 650 MG RE SUPP
650.0000 mg | RECTAL | Status: DC | PRN
Start: 2017-05-27 — End: 2017-05-28

## 2017-05-27 MED ORDER — PHENYLEPHRINE HCL 10 MG/ML IJ SOLN
INTRAVENOUS | Status: DC | PRN
  Administered 2017-05-27: 20 ug/min via INTRAVENOUS

## 2017-05-27 SURGICAL SUPPLY — 75 items
ADH SKN CLS APL DERMABOND .7 (GAUZE/BANDAGES/DRESSINGS) ×2
BAG DECANTER FOR FLEXI CONT (MISCELLANEOUS) ×2 IMPLANT
BLADE CLIPPER SURG (BLADE) IMPLANT
BUR ACRON 5.0MM COATED (BURR) ×2 IMPLANT
BUR MATCHSTICK NEURO 3.0 LAGG (BURR) ×2 IMPLANT
CANISTER SUCT 3000ML PPV (MISCELLANEOUS) ×2 IMPLANT
CAP LCK SPNE (Orthopedic Implant) ×4 IMPLANT
CAP LOCK SPINE RADIUS (Orthopedic Implant) IMPLANT
CAP LOCKING (Orthopedic Implant) ×8 IMPLANT
CARTRIDGE OIL MAESTRO DRILL (MISCELLANEOUS) ×1 IMPLANT
CONT SPEC 4OZ CLIKSEAL STRL BL (MISCELLANEOUS) ×2 IMPLANT
COVER BACK TABLE 60X90IN (DRAPES) ×2 IMPLANT
DERMABOND ADVANCED (GAUZE/BANDAGES/DRESSINGS) ×2
DERMABOND ADVANCED .7 DNX12 (GAUZE/BANDAGES/DRESSINGS) ×2 IMPLANT
DIFFUSER DRILL AIR PNEUMATIC (MISCELLANEOUS) ×3 IMPLANT
DRAPE C-ARM 42X72 X-RAY (DRAPES) ×5 IMPLANT
DRAPE C-ARMOR (DRAPES) IMPLANT
DRAPE HALF SHEET 40X57 (DRAPES) ×2 IMPLANT
DRAPE LAPAROTOMY 100X72X124 (DRAPES) ×2 IMPLANT
DRAPE POUCH INSTRU U-SHP 10X18 (DRAPES) ×2 IMPLANT
ELECT REM PT RETURN 9FT ADLT (ELECTROSURGICAL) ×2
ELECTRODE REM PT RTRN 9FT ADLT (ELECTROSURGICAL) ×1 IMPLANT
GAUZE SPONGE 4X4 12PLY STRL (GAUZE/BANDAGES/DRESSINGS) ×2 IMPLANT
GAUZE SPONGE 4X4 16PLY XRAY LF (GAUZE/BANDAGES/DRESSINGS) IMPLANT
GLOVE BIO SURGEON STRL SZ8 (GLOVE) ×1 IMPLANT
GLOVE BIOGEL PI IND STRL 6.5 (GLOVE) IMPLANT
GLOVE BIOGEL PI IND STRL 7.0 (GLOVE) IMPLANT
GLOVE BIOGEL PI IND STRL 8 (GLOVE) ×2 IMPLANT
GLOVE BIOGEL PI INDICATOR 6.5 (GLOVE) ×1
GLOVE BIOGEL PI INDICATOR 7.0 (GLOVE) ×1
GLOVE BIOGEL PI INDICATOR 8 (GLOVE) ×2
GLOVE ECLIPSE 7.5 STRL STRAW (GLOVE) ×4 IMPLANT
GLOVE INDICATOR 8.5 STRL (GLOVE) ×1 IMPLANT
GLOVE SURG SS PI 6.5 STRL IVOR (GLOVE) ×2 IMPLANT
GLOVE SURG SS PI 7.5 STRL IVOR (GLOVE) ×1 IMPLANT
GOWN STRL REUS W/ TWL LRG LVL3 (GOWN DISPOSABLE) ×1 IMPLANT
GOWN STRL REUS W/ TWL XL LVL3 (GOWN DISPOSABLE) ×1 IMPLANT
GOWN STRL REUS W/TWL 2XL LVL3 (GOWN DISPOSABLE) IMPLANT
GOWN STRL REUS W/TWL LRG LVL3 (GOWN DISPOSABLE) ×2
GOWN STRL REUS W/TWL XL LVL3 (GOWN DISPOSABLE) ×6
KIT BASIN OR (CUSTOM PROCEDURE TRAY) ×2 IMPLANT
KIT INFUSE X SMALL 1.4CC (Orthopedic Implant) ×1 IMPLANT
KIT ROOM TURNOVER OR (KITS) ×2 IMPLANT
NDL ASP BONE MRW 8GX15 (NEEDLE) IMPLANT
NDL SPNL 18GX3.5 QUINCKE PK (NEEDLE) IMPLANT
NDL SPNL 22GX3.5 QUINCKE BK (NEEDLE) ×2 IMPLANT
NEEDLE ASP BONE MRW 8GX15 (NEEDLE) ×2 IMPLANT
NEEDLE SPNL 18GX3.5 QUINCKE PK (NEEDLE) ×4 IMPLANT
NEEDLE SPNL 22GX3.5 QUINCKE BK (NEEDLE) ×4 IMPLANT
NS IRRIG 1000ML POUR BTL (IV SOLUTION) ×2 IMPLANT
OIL CARTRIDGE MAESTRO DRILL (MISCELLANEOUS) ×4
PACK LAMINECTOMY NEURO (CUSTOM PROCEDURE TRAY) ×2 IMPLANT
PAD ARMBOARD 7.5X6 YLW CONV (MISCELLANEOUS) ×8 IMPLANT
PATTIES SURGICAL .5 X.5 (GAUZE/BANDAGES/DRESSINGS) IMPLANT
PATTIES SURGICAL .5 X1 (DISPOSABLE) IMPLANT
PATTIES SURGICAL 1X1 (DISPOSABLE) IMPLANT
PEEK PLIF AVS 10X20X4 (Peek) ×2 IMPLANT
ROD 5.5X30MM (Rod) ×2 IMPLANT
SCREW 5.75 X 635 (Screw) ×2 IMPLANT
SCREW 5.75X45MM (Screw) ×2 IMPLANT
SPONGE LAP 4X18 X RAY DECT (DISPOSABLE) ×1 IMPLANT
SPONGE NEURO XRAY DETECT 1X3 (DISPOSABLE) IMPLANT
SPONGE SURGIFOAM ABS GEL 100 (HEMOSTASIS) ×2 IMPLANT
STRIP BIOACTIVE VITOSS 25X100X (Neuro Prosthesis/Implant) ×1 IMPLANT
STRIP BIOACTIVE VITOSS 25X52X4 (Orthopedic Implant) ×1 IMPLANT
SUT VIC AB 1 CT1 18XBRD ANBCTR (SUTURE) ×2 IMPLANT
SUT VIC AB 1 CT1 8-18 (SUTURE) ×4
SUT VIC AB 2-0 CP2 18 (SUTURE) ×4 IMPLANT
SYR 3ML LL SCALE MARK (SYRINGE) IMPLANT
SYR CONTROL 10ML LL (SYRINGE) ×2 IMPLANT
TAPE CLOTH SURG 4X10 WHT LF (GAUZE/BANDAGES/DRESSINGS) ×1 IMPLANT
TOWEL GREEN STERILE (TOWEL DISPOSABLE) ×2 IMPLANT
TOWEL GREEN STERILE FF (TOWEL DISPOSABLE) ×2 IMPLANT
TRAY FOLEY W/METER SILVER 16FR (SET/KITS/TRAYS/PACK) ×2 IMPLANT
WATER STERILE IRR 1000ML POUR (IV SOLUTION) ×2 IMPLANT

## 2017-05-27 NOTE — Op Note (Signed)
05/27/2017  12:27 PM  PATIENT:  Glenda Hill  61 y.o. 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; L5-S1 lumbar spondylolisthesis; lumbar spondylosis; lumbar degenerative disease  PROCEDURE:  Procedure(s):  Bilateral L5-S1 lumbar decompression including bilateral laminotomies, facetectomies, and foraminotomies for decompression of the stenotic compression pressure of the exiting L5 and S1 nerve roots bilaterally, with decompression beyond that required for interbody arthrodesis; bilateral L5-S1 posterior lumbar interbody arthrodesis with AVS peek interbody implants, Vitoss BA with bone marrow aspirate, and infuse; bilateral L5-S1 posterior lateral arthrodesis with nonsegmental radius posterior instrumentation, Vitoss BA with bone marrow aspirate, and infuse  SURGEON:  Shirlean Kellyobert Nudelman, M.D.  ASSISTANTS: Donalee CitrinGary Cram, M.D.  ANESTHESIA:   general  EBL:  Total I/O In: 1400 [I.V.:1400] Out: 485 [Urine:385; Blood:100]  BLOOD ADMINISTERED:none  CELL SAVER GIVEN: Cell Saver technician felt that there was insufficient blood loss to return blood to the patient.  COUNT: Correct per nursing staff  DICTATION: Patient is brought to the operating room placed under general endotracheal anesthesia. The patient was turned to prone position the lumbar region was prepped with Betadine soap and solution and draped in a sterile fashion. The previous midline incision was marked. The midline was infiltrated with local anesthesia with epinephrine. A localizing x-ray was taken and then a midline incision was made carried down through the subcutaneous tissue, bipolar cautery and electrocautery were used to maintain hemostasis. Dissection was carried down to the lumbar fascia. The fascia  was incised bilaterally and the paraspinal muscles were dissected with a spinous process and lamina in a subperiosteal fashion. Particular care was taken on the right side where previous right L5-S1 lumbar laminotomy and medial facetectomy had been performed 4 years ago by another Careers advisersurgeon. Another x-ray was taken for localization and the L5-S1 level was localized. Dissection was then carried out laterally over the facet complex and the transverse processes of L5 and the ala of S1 were exposed and decorticated.  We began with a left L5-S1 laminotomy and facetectomy, using the high-speed drill and Kerrison punches. The thickened ligamentum flavum was carefully removed, and we identified the thecal sac and exiting left L5 and S1 nerve roots. There is significant stenosis within the left L5-S1 neural foramen was carefully removed, and foraminotomies were performed so as to decompressed the stenotic compression of the exiting left L5 and S1 nerve roots. We then proceeded with the discectomy on the left side. Epidural veins were coagulated and divided, and then the left L5-S1 annulus was incised, and the disc space entered. A thorough discectomy was performed using a variety of micro-curettes and pituitary rongeurs. A subligamentous disc herniation the midline was mobilized and removed in a piecemeal fashion.  Once good decompression had been achieved on the left side, we proceeded to the right side. The margins the previous laminotomy were defined, and the right L5-S1 laminotomy was extended. Further facetectomy was performed, and then we proceeded with foraminotomies to decompress the exiting right L5 and S1 nerve roots. Decompression of the stenotic compression of the exiting right L5 and S1 nerve roots was achieved. We then proceeded with a discectomy from the right side. Epidural veins were coagulated and divided, and then the right L5-S1 annulus was incised. The disc space was entered, and a thorough discectomy  was performed using a variety of micro-curettes and pituitary rongeurs. In the end good decompression  of the thecal sac and of the exiting L5 and S1 nerve roots was achieved bilaterally.  We then proceeded with the interbody arthrodesis.  The height of the intervertebral disc space was measured. We selected 10 x 20 x 4 AVS peek interbody implants.  The C-arm fluoroscope was then draped and brought in the field and we identified the pedicle entry points bilaterally at the L5 and S1 levels. Each of the 4 pedicles was probed, we aspirated bone marrow aspirate from the vertebral bodies, this was injected over a 10 cc and a 5 cc strip of Vitoss BA. Then each of the pedicles was examined with the ball probe good bony surfaces were found and no bony cuts were found. Each of the pedicles was then tapped with a 5.25 mm tap, again examined with the ball probe good threading was found and no bony cuts were found. We then placed 5.75 x 45 mm screws bilaterally at the L5 level and 5.75 x 35 mm screws bilaterally at the S1 level.  We then packed the AVS peek interbody implants with Vitoss BA with bone marrow aspirate and infuse, and then placed the first implant and on the right side, carefully retracting the thecal sac and nerve root medially. We then went back to the left side and packed the midline with additional Vitoss BA with bone marrow aspirate and infuse, and then placed a second implant and on the left side again retracting the thecal sac and nerve root medially.   We then packed the lateral gutter over the transverse processes, ala, and intertransverse space with Vitoss BA with bone marrow aspirate and infuse. We then selected a 30 mm pre-lordosed rods, they were placed within the screw heads and secured with locking caps once all 4 locking caps were placed final tightening was performed against a counter torque.  The wound had been irrigated multiple times during the procedure with saline solution and  bacitracin solution, good hemostasis was established with a combination of bipolar cautery and Gelfoam with thrombin. Once good hemostasis was confirmed we proceeded with closure paraspinal muscles deep fascia and Scarpa's fascia were closed with interrupted undyed 1 Vicryl sutures the subcutaneous and subcuticular closed with interrupted inverted 2-0 undyed Vicryl sutures the skin edges were approximated with Dermabond. There was dressed with sterile gauze and Hypafix.  Following surgery the patient was turned back to the supine position to be reversed and the anesthetic extubated and transferred to the recovery room for further care.   PLAN OF CARE: Admit to inpatient   PATIENT DISPOSITION:  PACU - hemodynamically stable.   Delay start of Pharmacological VTE agent (>24hrs) due to surgical blood loss or risk of bleeding:  yes

## 2017-05-27 NOTE — Transfer of Care (Signed)
Immediate Anesthesia Transfer of Care Note  Patient: Glenda Hill  Procedure(s) Performed: Procedure(s): LUMBAR FIVE- SACRAL ONE LUMBAR DECOMPRESSION, POSTERIOR LUMBAR INTERBODY FUSION, POSTERIOR LATERAL ARTHRODESIS (N/A)  Patient Location: PACU  Anesthesia Type:General  Level of Consciousness: awake, alert , oriented and patient cooperative  Airway & Oxygen Therapy: Patient Spontanous Breathing  Post-op Assessment: Report given to RN and Post -op Vital signs reviewed and stable  Post vital signs: Reviewed and stable  Last Vitals:  Vitals:   05/27/17 0552  BP: (!) 177/87  Pulse: 71  Resp: 20  Temp: 36.7 C    Last Pain:  Vitals:   05/27/17 0611  TempSrc:   PainSc: 7       Patients Stated Pain Goal: 6 (05/27/17 [id number])  Complications: No apparent anesthesia complications

## 2017-05-27 NOTE — H&P (Signed)
Subjective: Patient is a 61 y.o. right-handed white female who is admitted for treatment of low back pain and bilateral lumbar radicular pain. Symptoms began following a work injury in December 2017. She describes painful left-sided low back, radiating down the left buttock, posterior thigh, calf, with cramping in the left calf, as well as right lumbar radicular pain that extends into the right buttock and posterior thigh. She had previous surgery 4 years ago in July 2014 by Dr. Trey Sailors in which we believe he performed a right L5-S1 lumbar laminotomy and resection of synovial cyst. She's been treated with medications as well as spinal injections without relief. Workup has included both CT and MRI. 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 There is mild left L4-5 facet arthropathy as well. MRI shows mild central disc bulging at L4-5 and moderate left L4-5 hypertrophic facet arthropathy, but no significant canal stenosis. The most significant findings are at the L5-S1 level with there is a spondylolisthesis of L5 and S1 with associated description located centrally extending rostrally behind the body of L5. There is evidence of the L5 pars interarticularis defect, as well as hypertrophic left L5-S1 facet arthropathy. There is moderate to marked multifactorial canal stenosis but marked to severe left L5-S1 neural foraminal stenosis with more mild right L5-S1 neural foraminal stenosis. Patient is admitted now for bilateral L5-S1 lumbar decompression including bilateral laminectomy, facetectomy, and foraminotomy, bilateral L5-S1 discectomy, bilateral L5-S1 posterior lumbar interbody arthrodesis with interbody implants and bone graft, and bilateral L5-S1 posterior lateral arthrodesis with posterior instrumentation and bone graft.   Patient Active Problem List   Diagnosis Date Noted  . Upper airway cough syndrome  01/16/2016  . Cigarette smoker 01/16/2016  . Tibial plateau fracture 05/15/2012   Past Medical History:  Diagnosis Date  . Complication of anesthesia    trouble waking up after C section  . Hypertension     Past Surgical History:  Procedure Laterality Date  . BACK SURGERY    . CESAREAN SECTION      Prescriptions Prior to Admission  Medication Sig Dispense Refill Last Dose  . Calcium Carb-Cholecalciferol (CALCIUM+D3 PO) Take 1 tablet by mouth daily.   05/26/2017 at Unknown time  . cholecalciferol (VITAMIN D) 1000 units tablet Take 1,000 Units by mouth daily.   05/26/2017 at Unknown time  . ibuprofen (ADVIL,MOTRIN) 200 MG tablet Take 400 mg by mouth every 6 (six) hours as needed for headache (pain).   Taking  . losartan-hydrochlorothiazide (HYZAAR) 100-12.5 MG tablet Take 1 tablet by mouth daily. 90 tablet 3 05/27/2017 at 0330  . Multiple Vitamin (MULTIVITAMIN WITH MINERALS) TABS tablet Take 1 tablet by mouth daily.   Past Week at Unknown time   No Known Allergies  Social History  Substance Use Topics  . Smoking status: Current Every Day Smoker    Packs/day: 1.00    Years: 4.00    Types: Cigarettes  . Smokeless tobacco: Never Used     Comment: "stopped for a while"   . Alcohol use 0.0 oz/week     Comment: occasionally a beer    Family History  Problem Relation Age of Onset  . Heart disease Mother   . Lung cancer Mother        smoked  . Emphysema Mother        smoked  . Arthritis Unknown   . Lung disease Unknown   . Diabetes Unknown   . Ovarian  cancer Sister      Review of Systems A comprehensive review of systems was negative.  Objective: Vital signs in last 24 hours: Temp:  [98 F (36.7 C)] 98 F (36.7 C) (07/09 0552) Pulse Rate:  [71] 71 (07/09 0552) Resp:  [20] 20 (07/09 0552) BP: (177)/(87) 177/87 (07/09 0552) SpO2:  [97 %] 97 % (07/09 0552) Weight:  [65.8 kg (145 lb)] 65.8 kg (145 lb) (07/09 0552)  EXAM: Patient well-developed well-nourished white female in  no acute distress. Lungs are clear to auscultation , the patient has symmetrical respiratory excursion. Heart has a regular rate and rhythm normal S1 and S2 no murmur.   Abdomen is soft nontender nondistended bowel sounds are present. Extremity examination shows mild clubbing. There is no cyanosis or edema. Motor examination shows 5 over 5 strength in the lower extremities including the iliopsoas quadriceps dorsiflexor extensor hallicus  longus and plantar flexor bilaterally. Sensation is intact to pinprick in the distal lower extremities. Reflexes are symmetrical bilaterally. No pathologic reflexes are present. Patient has a normal gait and stance.   Data Review:CBC    Component Value Date/Time   WBC 5.7 05/17/2017 1523   RBC 4.86 05/17/2017 1523   HGB 15.5 (H) 05/17/2017 1523   HCT 45.4 05/17/2017 1523   PLT 274 05/17/2017 1523   MCV 93.4 05/17/2017 1523   MCH 31.9 05/17/2017 1523   MCHC 34.1 05/17/2017 1523   RDW 13.5 05/17/2017 1523   LYMPHSABS 3.1 12/28/2014 2013   MONOABS 0.4 12/28/2014 2013   EOSABS 0.3 12/28/2014 2013   BASOSABS 0.2 (H) 12/28/2014 2013                          BMET    Component Value Date/Time   NA 134 (L) 05/17/2017 1523   K 3.9 05/17/2017 1523   CL 98 (L) 05/17/2017 1523   CO2 27 05/17/2017 1523   GLUCOSE 118 (H) 05/17/2017 1523   BUN 8 05/17/2017 1523   CREATININE 0.79 05/17/2017 1523   CALCIUM 9.7 05/17/2017 1523   GFRNONAA >60 05/17/2017 1523   GFRAA >60 05/17/2017 1523     Assessment/Plan: Patient with low back and bilateral lumbar radicular pain, left and right, with previous right L5-S1 lumbar decompression 4 years ago by Dr. Trey SailorsMark Roy. Patient now has advanced degeneration at L5-S1 with associated spondylolisthesis, significant canal stenosis, and bilateral neural foraminal stenosis, marked to severe on the left and mild on the right. Patient admitted now for an L5-S1 lumbar decompression and stabilization.  I've discussed with the patient the  nature of his condition, the nature the surgical procedure, the typical length of surgery, hospital stay, and overall recuperation, the limitations postoperatively, and risks of surgery. I discussed risks including risks of infection, bleeding, possibly need for transfusion, the risk of nerve root dysfunction with pain, weakness, numbness, or paresthesias, the risk of dural tear and CSF leakage and possible need for further surgery, the risk of failure of the arthrodesis and possibly for further surgery, the risk of anesthetic complications including myocardial infarction, stroke, pneumonia, and death. We discussed the need for postoperative immobilization in a lumbar brace. Understanding all this the patient does wish to proceed with surgery and is admitted for such.     Hewitt ShortsNUDELMAN,ROBERT W, MD 05/27/2017 7:09 AM

## 2017-05-27 NOTE — Anesthesia Preprocedure Evaluation (Addendum)
Anesthesia Evaluation  Patient identified by MRN, date of birth, ID band Patient awake    Reviewed: Allergy & Precautions, NPO status , Patient's Chart, lab work & pertinent test results  History of Anesthesia Complications (+) PROLONGED EMERGENCE  Airway Mallampati: II  TM Distance: >3 FB Neck ROM: Full    Dental  (+) Missing, Chipped, Dental Advisory Given,    Pulmonary Current Smoker,    breath sounds clear to auscultation       Cardiovascular hypertension,  Rhythm:Regular Rate:Normal     Neuro/Psych    GI/Hepatic negative GI ROS, Neg liver ROS,   Endo/Other  negative endocrine ROS  Renal/GU negative Renal ROS     Musculoskeletal   Abdominal   Peds  Hematology negative hematology ROS (+)   Anesthesia Other Findings   Reproductive/Obstetrics                            Anesthesia Physical Anesthesia Plan  ASA: II  Anesthesia Plan: General   Post-op Pain Management:    Induction: Intravenous  PONV Risk Score and Plan: 3 and Ondansetron, Dexamethasone, Propofol and Midazolam  Airway Management Planned: Oral ETT  Additional Equipment:   Intra-op Plan:   Post-operative Plan: Extubation in OR  Informed Consent: I have reviewed the patients History and Physical, chart, labs and discussed the procedure including the risks, benefits and alternatives for the proposed anesthesia with the patient or authorized representative who has indicated his/her understanding and acceptance.     Plan Discussed with: CRNA  Anesthesia Plan Comments:         Anesthesia Quick Evaluation

## 2017-05-27 NOTE — Progress Notes (Signed)
Vitals:   05/27/17 1405 05/27/17 1408 05/27/17 1438 05/27/17 1629  BP: (!) 105/54  113/66 130/82  Pulse: 66 66 76 82  Resp: 13 13 16 16   Temp:  (!) 97.2 F (36.2 C) 97.6 F (36.4 C) 97.7 F (36.5 C)  TempSrc:      SpO2: (!) 89% (!) 89% 95% 97%  Weight:        Chin resting in bedside chair. Has walked in the halls at least twice. Becoming steadier. Excellent relief of radicular pain, with only mild incisional discomfort. Dressing clean and dry. Foley DC'd, nursing staff monitoring voiding function.  Plan: Encouraged to ambulate. Continue to progress through postoperative recovery.  Hewitt ShortsNUDELMAN,ROBERT W, MD 05/27/2017, 8:44 PM

## 2017-05-27 NOTE — Anesthesia Procedure Notes (Signed)
Procedure Name: Intubation Date/Time: 05/27/2017 7:40 AM Performed by: Salli Quarry  Pre-anesthesia Checklist: Patient identified, Emergency Drugs available, Suction available and Patient being monitored Patient Re-evaluated:Patient Re-evaluated prior to inductionOxygen Delivery Method: Circle System Utilized Preoxygenation: Pre-oxygenation with 100% oxygen Intubation Type: IV induction Ventilation: Mask ventilation without difficulty Laryngoscope Size: Mac and 3 Grade View: Grade I Tube type: Oral Tube size: 7.5 mm Number of attempts: 1 Airway Equipment and Method: Stylet Placement Confirmation: ETT inserted through vocal cords under direct vision,  positive ETCO2 and breath sounds checked- equal and bilateral Secured at: 22 cm Tube secured with: Tape Dental Injury: Teeth and Oropharynx as per pre-operative assessment

## 2017-05-28 MED ORDER — HYDROCODONE-ACETAMINOPHEN 5-325 MG PO TABS: 1.0000 | ORAL_TABLET | ORAL | 0 refills | Status: DC | PRN

## 2017-05-28 MED FILL — Heparin Sodium (Porcine) Inj 1000 Unit/ML: INTRAMUSCULAR | Qty: 30 | Status: AC

## 2017-05-28 MED FILL — Sodium Chloride IV Soln 0.9%: INTRAVENOUS | Qty: 2000 | Status: AC

## 2017-05-28 NOTE — Anesthesia Postprocedure Evaluation (Signed)
Anesthesia Post Note  Patient: Glenda DeedKathryn S Delaughter  Procedure(s) Performed: Procedure(s) (LRB): LUMBAR FIVE- SACRAL ONE LUMBAR DECOMPRESSION, POSTERIOR LUMBAR INTERBODY FUSION, POSTERIOR LATERAL ARTHRODESIS (N/A)     Patient location during evaluation: PACU Anesthesia Type: General Level of consciousness: awake and sedated Pain management: pain level controlled Vital Signs Assessment: post-procedure vital signs reviewed and stable Respiratory status: spontaneous breathing, nonlabored ventilation, respiratory function stable and patient connected to nasal cannula oxygen Cardiovascular status: blood pressure returned to baseline and stable Postop Assessment: no signs of nausea or vomiting Anesthetic complications: no    Last Vitals:  Vitals:   05/28/17 0400 05/28/17 0746  BP: 134/67 (!) 147/76  Pulse: 73 (!) 59  Resp: 16 16  Temp: 37 C 36.8 C    Last Pain:  Vitals:   05/28/17 0617  TempSrc:   PainSc: Asleep                 ,JAMES TERRILL

## 2017-05-28 NOTE — Discharge Summary (Signed)
Physician Discharge Summary  Patient ID: Glenda Hill MRN: [id number] DOB/AGE: 59/01/1956 59 y.o.  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 Postoperatively she has done well. She is up and ambulate actively. She is voiding well. The nursing staff is going to remove her dressing prior to discharge. She is asking to be discharged to home. She has been given instructions regarding wound care and activities following discharge. She is scheduled for follow-up with me in the office in 3 weeks.  Discharge Exam: Blood pressure (!) 147/76, pulse (!) 59, temperature 98.3 F (36.8 C), resp. rate 16, weight 65.8 kg (145 lb), SpO2 98 %.  Disposition: 01-Home or Self Care  Discharge Instructions    Discharge wound care:    Complete by:  As directed    Leave the wound open to air. Shower daily with the wound uncovered. Water and soapy water should run over the incision area. Do not wash directly on the incision for 2 weeks. Remove the glue after 2 weeks.   Driving Restrictions    Complete by:  As directed    No driving for 2 weeks. May ride in the car locally now. May begin to drive locally in 2 weeks.   Other Restrictions    Complete by:  As directed    Walk gradually increasing distances out in the fresh air at least twice a day. Walking additional 6 times inside the house, gradually increasing distances, daily. No bending, lifting, or twisting. Perform activities between shoulder and waist  height (that is at counter height when standing or table height when sitting).     Allergies as of 05/28/2017   No Known Allergies     Medication List    TAKE these medications   CALCIUM+D3 PO Take 1 tablet by mouth daily.   cholecalciferol 1000 units tablet Commonly known as:  VITAMIN D Take 1,000 Units by mouth daily.   HYDROcodone-acetaminophen 5-325 MG tablet Commonly known as:  NORCO/VICODIN Take 1-2 tablets by mouth every 4 (four) hours as needed for moderate pain.   ibuprofen 200 MG tablet Commonly known as:  ADVIL,MOTRIN Take 400 mg by mouth every 6 (six) hours as needed for headache (pain).   losartan-hydrochlorothiazide 100-12.5 MG tablet Commonly known as:  HYZAAR Take 1 tablet by mouth daily.   multivitamin with minerals Tabs tablet Take 1 tablet by mouth daily.        SignedHewitt Shorts: NUDELMAN,ROBERT W 05/28/2017, 8:25 AM

## 2017-05-28 NOTE — Discharge Instructions (Signed)

## 2017-05-28 NOTE — Progress Notes (Signed)
Patient alert and oriented, mae's well, voiding adequate amount of urine, swallowing without difficulty, no c/o pain at time of discharge. Patient discharged home with family. Script and discharged instructions given to patient. Patient and family stated understanding of instructions given. Patient has an appointment with Dr. Nudelman 

## 2018-06-02 IMAGING — RF DG LUMBAR SPINE COMPLETE 4+V
1 series · 4 of 4 positions shown · non-contrast
Comparison: MRI lumbar spine 03/08/2017

CLINICAL DATA: Lumbar fusion.

EXAM:
LUMBAR SPINE - 2-3 VIEW; DG C-ARM 61-120 MIN; LUMBAR SPINE -
COMPLETE 4+ VIEW

[Series 1: run · 4 of 4 slices shown]
[im 1/4]
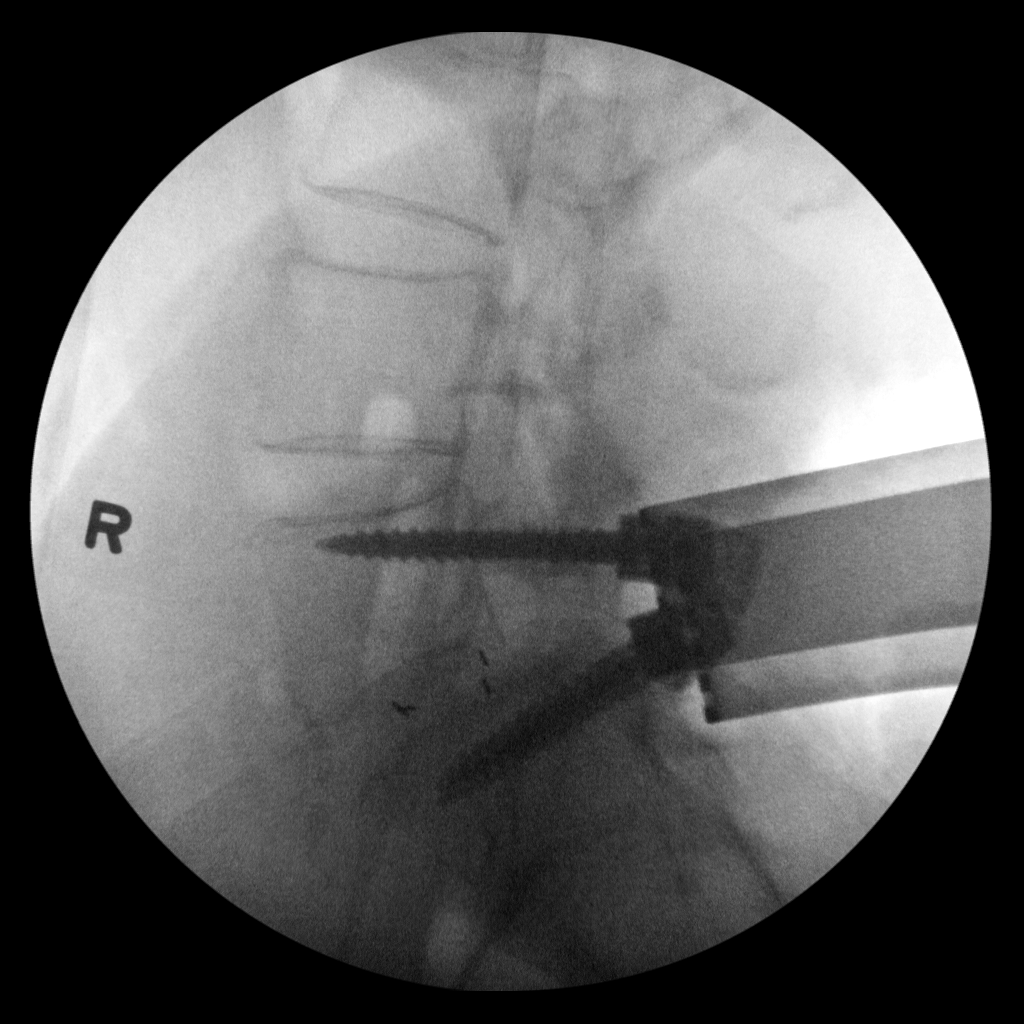
[im 2/4]
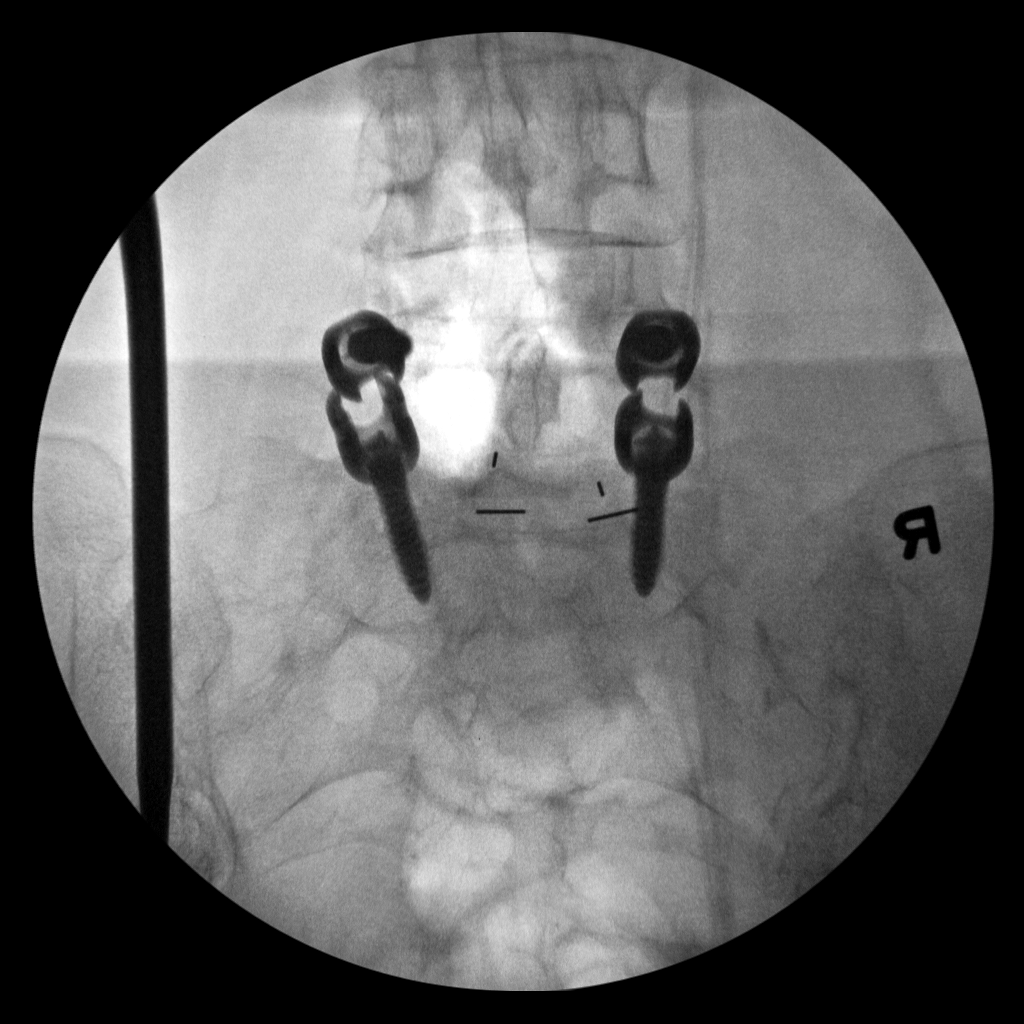
[im 3/4]
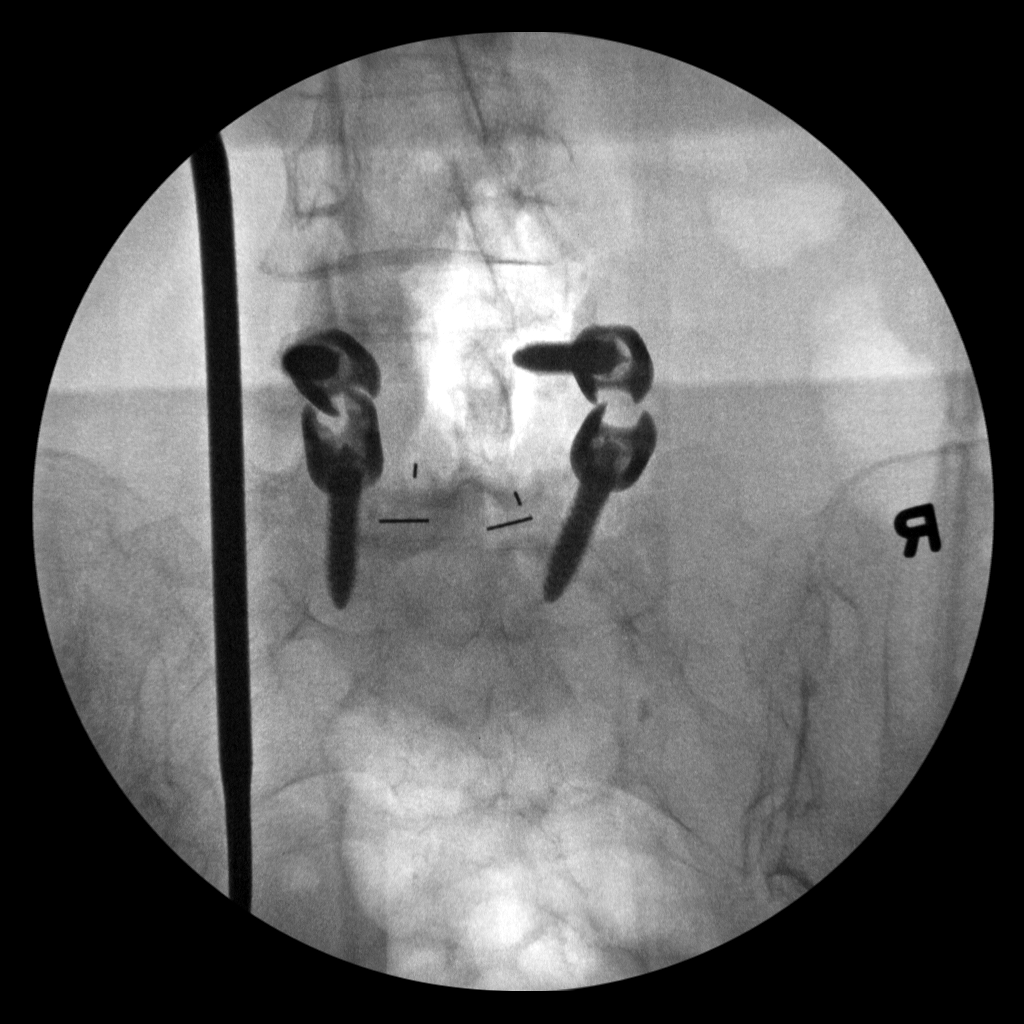
[im 4/4]
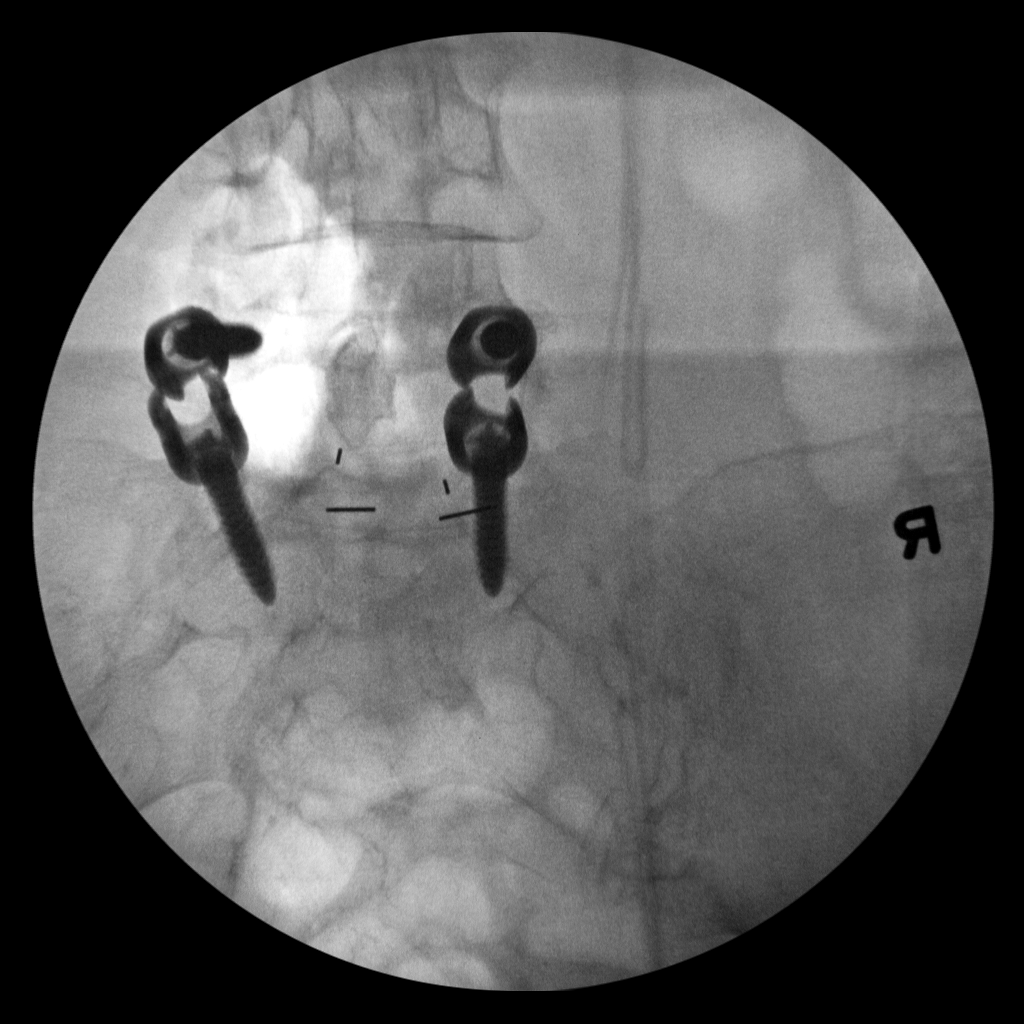

[4 of 4 positions shown; findings below may reference images not displayed]

FINDINGS: Initial intraoperative localization lumbar spine films demonstrate a
spinal needle marking the L5 level. The second film demonstrates a
surgical retractor and a surgical instrument marking the L5-S1 disc
space.

Subsequent intraoperative spot films demonstrate pedicle screws and
an interbody fusion device at L5-S1. The hardware is in good
position without complicating features.
IMPRESSION: L5-S1 surgical fusion hardware in good position without complicating
features.

## 2021-09-19 HISTORY — PX: COLECTOMY: SHX59

## 2021-09-28 DIAGNOSIS — K651 Peritoneal abscess: Secondary | ICD-10-CM | POA: Insufficient documentation

## 2021-09-28 DIAGNOSIS — K5792 Diverticulitis of intestine, part unspecified, without perforation or abscess without bleeding: Secondary | ICD-10-CM | POA: Insufficient documentation

## 2021-09-28 DIAGNOSIS — J449 Chronic obstructive pulmonary disease, unspecified: Secondary | ICD-10-CM | POA: Diagnosis present

## 2021-09-28 DIAGNOSIS — K5720 Diverticulitis of large intestine with perforation and abscess without bleeding: Secondary | ICD-10-CM | POA: Insufficient documentation

## 2021-10-11 DIAGNOSIS — Z933 Colostomy status: Secondary | ICD-10-CM

## 2021-10-12 DIAGNOSIS — Z87898 Personal history of other specified conditions: Secondary | ICD-10-CM | POA: Insufficient documentation

## 2021-10-12 DIAGNOSIS — F419 Anxiety disorder, unspecified: Secondary | ICD-10-CM | POA: Diagnosis present

## 2021-10-12 DIAGNOSIS — E8809 Other disorders of plasma-protein metabolism, not elsewhere classified: Secondary | ICD-10-CM | POA: Insufficient documentation

## 2021-10-12 DIAGNOSIS — E440 Moderate protein-calorie malnutrition: Secondary | ICD-10-CM | POA: Insufficient documentation

## 2022-11-19 DIAGNOSIS — D0512 Intraductal carcinoma in situ of left breast: Secondary | ICD-10-CM

## 2022-11-19 HISTORY — DX: Intraductal carcinoma in situ of left breast: D05.12

## 2023-03-12 DIAGNOSIS — E559 Vitamin D deficiency, unspecified: Secondary | ICD-10-CM | POA: Diagnosis not present

## 2023-03-12 DIAGNOSIS — Z79899 Other long term (current) drug therapy: Secondary | ICD-10-CM | POA: Diagnosis not present

## 2023-03-12 DIAGNOSIS — R5383 Other fatigue: Secondary | ICD-10-CM | POA: Diagnosis not present

## 2023-03-12 DIAGNOSIS — E7800 Pure hypercholesterolemia, unspecified: Secondary | ICD-10-CM | POA: Diagnosis not present

## 2023-04-10 ENCOUNTER — Other Ambulatory Visit: Payer: Self-pay | Admitting: Internal Medicine

## 2023-04-10 DIAGNOSIS — Z1231 Encounter for screening mammogram for malignant neoplasm of breast: Secondary | ICD-10-CM

## 2023-05-06 ENCOUNTER — Ambulatory Visit: Payer: Self-pay | Admitting: Surgery

## 2023-05-06 DIAGNOSIS — Z720 Tobacco use: Secondary | ICD-10-CM | POA: Insufficient documentation

## 2023-05-06 DIAGNOSIS — R739 Hyperglycemia, unspecified: Secondary | ICD-10-CM

## 2023-05-14 ENCOUNTER — Encounter (INDEPENDENT_AMBULATORY_CARE_PROVIDER_SITE_OTHER): Payer: Self-pay | Admitting: *Deleted

## 2023-05-20 ENCOUNTER — Other Ambulatory Visit: Payer: Self-pay | Admitting: Surgery

## 2023-05-20 ENCOUNTER — Other Ambulatory Visit (HOSPITAL_COMMUNITY): Payer: Self-pay | Admitting: Surgery

## 2023-05-20 DIAGNOSIS — K631 Perforation of intestine (nontraumatic): Secondary | ICD-10-CM

## 2023-05-27 ENCOUNTER — Other Ambulatory Visit: Payer: Self-pay | Admitting: Internal Medicine

## 2023-05-27 DIAGNOSIS — R928 Other abnormal and inconclusive findings on diagnostic imaging of breast: Secondary | ICD-10-CM

## 2023-06-03 ENCOUNTER — Ambulatory Visit
Admission: RE | Admit: 2023-06-03 | Discharge: 2023-06-03 | Disposition: A | Discharge status: Home / Self Care | Payer: Medicare Other | Source: Ambulatory Visit | Attending: Internal Medicine | Admitting: Internal Medicine

## 2023-06-03 DIAGNOSIS — R928 Other abnormal and inconclusive findings on diagnostic imaging of breast: Secondary | ICD-10-CM

## 2023-06-03 HISTORY — PX: BREAST BIOPSY: SHX20

## 2023-06-07 ENCOUNTER — Ambulatory Visit (HOSPITAL_COMMUNITY)
Admission: RE | Admit: 2023-06-07 | Discharge: 2023-06-07 | Disposition: A | Discharge status: Home / Self Care | Payer: Medicare Other | Source: Ambulatory Visit | Attending: Surgery | Admitting: Surgery

## 2023-06-07 DIAGNOSIS — K631 Perforation of intestine (nontraumatic): Secondary | ICD-10-CM | POA: Insufficient documentation

## 2023-06-07 MED ORDER — IOHEXOL 300 MG/ML  SOLN
100.0000 mL | Freq: Once | INTRAMUSCULAR | Status: AC | PRN
  Administered 2023-06-07: 100 mL via INTRAVENOUS

## 2023-06-17 ENCOUNTER — Ambulatory Visit: Payer: Self-pay | Admitting: Surgery

## 2023-06-17 ENCOUNTER — Telehealth: Payer: Self-pay | Admitting: Radiation Oncology

## 2023-06-17 DIAGNOSIS — D0512 Intraductal carcinoma in situ of left breast: Secondary | ICD-10-CM

## 2023-06-17 NOTE — Telephone Encounter (Signed)
Left message for patient to call back to schedule consult per 7/29 referral.

## 2023-06-19 ENCOUNTER — Other Ambulatory Visit: Payer: Self-pay | Admitting: Surgery

## 2023-06-19 DIAGNOSIS — D0512 Intraductal carcinoma in situ of left breast: Secondary | ICD-10-CM

## 2023-06-24 DIAGNOSIS — D0512 Intraductal carcinoma in situ of left breast: Secondary | ICD-10-CM | POA: Insufficient documentation

## 2023-06-24 NOTE — Progress Notes (Signed)
Radiation Oncology         [phone] ________________________________  Initial Outpatient Consultation - Conducted via telephone at patient request.  I spoke with the patient to conduct this consult visit via telephone. The patient was notified in advance and was offered an in person or telemedicine meeting to allow for face to face communication but instead preferred to proceed with a telephone consult.   Name: Glenda Hill        MRN: [id number]  Date of Service: 06/27/2023 DOB: 08-14-1956  WU:[id number], Angelina Pih, MD  Harriette Bouillon, MD     REFERRING PHYSICIAN: Harriette Bouillon, MD   DIAGNOSIS: The encounter diagnosis was Ductal carcinoma in situ (DCIS) of left breast.   HISTORY OF PRESENT ILLNESS: Glenda Hill is a 67 y.o. female seen at the request of Dr. Luisa Hart for a new diagnosis of left breast cancer. The patient was noted to have .  possible asymmetries on her screening mammogram in June 2024 at [facility] in [place].  Further diagnostic workup showed multiple vague areas of shadowing in the upper outer quadrant of the left breast though no reproducible sonographic correlate could be seen.  She underwent a stereotactic biopsy in [place] at the breast center on 06/03/2023 which showed intermediate grade DCIS that was ER/PR positive.  She met with Dr. Luisa Hart who recommended a lumpectomy surgery which she is scheduled for on 07/04/23, and is seen to discuss adjuvant treatment.   PREVIOUS RADIATION THERAPY: No   PAST MEDICAL HISTORY:  Past Medical History:  Diagnosis Date   Complication of anesthesia    trouble waking up after C section   Hypertension        PAST SURGICAL HISTORY: Past Surgical History:  Procedure Laterality Date   BACK SURGERY     BREAST BIOPSY Left 06/03/2023   MM LT BREAST BX W LOC DEV 1ST LESION IMAGE BX SPEC STEREO GUIDE 06/03/2023 [facility]   CESAREAN SECTION       FAMILY HISTORY:  Family History  Problem Relation Age of  Onset   Heart disease Mother    Lung cancer Mother        smoked   Emphysema Mother        smoked   Arthritis Unknown    Lung disease Unknown    Diabetes Unknown    Ovarian cancer Sister      SOCIAL HISTORY:  reports that she has been smoking cigarettes. She has a 4 pack-year smoking history. She has never used smokeless tobacco. She reports current alcohol use. She reports that she does not use drugs.  The patient is divorced and lives in [place].  She is retired    ALLERGIES: Patient has no known allergies.   MEDICATIONS:  Current Outpatient Medications  Medication Sig Dispense Refill   amLODipine (NORVASC) 10 MG tablet Take 10 mg by mouth daily.     Calcium Carb-Cholecalciferol 600-20 MG-MCG TABS Take 600 mg by mouth 2 (two) times daily.     candesartan-hydrochlorothiazide (ATACAND HCT) 32-12.5 MG tablet Take 1 tablet by mouth daily.     carvedilol (COREG) 12.5 MG tablet Take 12.5 mg by mouth 2 (two) times daily.     ibuprofen (ADVIL,MOTRIN) 200 MG tablet Take 400 mg by mouth every 6 (six) hours as needed for headache (pain).     Multiple Vitamin (MULTIVITAMIN WITH MINERALS) TABS tablet Take 1 tablet by mouth daily. Woman     pantoprazole (PROTONIX) 40 MG tablet Take 40 mg  by mouth daily.     TRELEGY ELLIPTA 100-62.5-25 MCG/ACT AEPB Inhale 1 puff into the lungs daily.     Vitamin D, Ergocalciferol, (DRISDOL) 1.25 MG (50000 UNIT) CAPS capsule Take 50,000 Units by mouth once a week.     No current facility-administered medications for this visit.     REVIEW OF SYSTEMS: On review of systems, the patient reports that she is doing well overall without breast specific complaints.      PHYSICAL EXAM:  Unable to assess due to encounter type.    ECOG = 0  0 - Asymptomatic (Fully active, able to carry on all predisease activities without restriction)  1 - Symptomatic but completely ambulatory (Restricted in physically strenuous activity but ambulatory and able to carry out work  of a light or sedentary nature. For example, light housework, office work)  2 - Symptomatic, <50% in bed during the day (Ambulatory and capable of all self care but unable to carry out any work activities. Up and about more than 50% of waking hours)  3 - Symptomatic, >50% in bed, but not bedbound (Capable of only limited self-care, confined to bed or chair 50% or more of waking hours)  4 - Bedbound (Completely disabled. Cannot carry on any self-care. Totally confined to bed or chair)  5 - Death   [name] MM, Creech RH, Tormey DC, et al. [phone]). "Toxicity and response criteria of the [facility]". Am. Evlyn Clines. Oncol. 5 (6): 649-55    LABORATORY DATA:  Lab Results  Component Value Date   WBC 5.7 05/17/2017   HGB 15.5 (H) 05/17/2017   HCT 45.4 05/17/2017   MCV 93.4 05/17/2017   PLT 274 05/17/2017   Lab Results  Component Value Date   NA 134 (L) 05/17/2017   K 3.9 05/17/2017   CL 98 (L) 05/17/2017   CO2 27 05/17/2017   Lab Results  Component Value Date   ALT 11 12/28/2014   AST 16 12/28/2014   ALKPHOS 68 12/28/2014   BILITOT 0.4 12/28/2014      RADIOGRAPHY: CT ABDOMEN PELVIS W CONTRAST  Result Date: 06/15/2023 CLINICAL DATA:  Previous colon resection for perforated sigmoid diverticulitis. Preop for colostomy reversal. EXAM: CT ABDOMEN AND PELVIS WITH CONTRAST TECHNIQUE: Multidetector CT imaging of the abdomen and pelvis was performed using the standard protocol following bolus administration of intravenous contrast. RADIATION DOSE REDUCTION: This exam was performed according to the departmental dose-optimization program which includes automated exposure control, adjustment of the mA and/or kV according to patient size and/or use of iterative reconstruction technique. CONTRAST:  OMNIPAQUE IOHEXOL 300 MG/ML  SOLN COMPARISON:  None Available. FINDINGS: Lower Chest: No acute findings. Hepatobiliary: No suspicious hepatic masses identified. Gallbladder is  unremarkable. No evidence of biliary ductal dilatation. Pancreas:  No mass or inflammatory changes. Spleen: Within normal limits in size. Probable tiny hepatic cyst or hemangioma noted. Adrenals/Urinary Tract: No suspicious masses identified. No evidence of ureteral calculi or hydronephrosis. Stomach/Bowel: Left lower quadrant colostomy is seen. Diverticulosis is seen involving the ascending and sigmoid colon. There is persistent wall thickening and mild pericolonic soft tissue stranding involving the proximal sigmoid colon, consistent with mild diverticulitis. No evidence of perforation or abscess. Vascular/Lymphatic: No pathologically enlarged lymph nodes. No acute vascular findings. Reproductive: 3.2 cm posterior uterine fibroid noted. A 1.5 cm benign-appearing right ovarian cyst is also seen. No No evidence of inflammatory changes or abnormal fluid collections. Other:  None. Musculoskeletal:  No suspicious bone lesions identified. IMPRESSION: Left  lower quadrant colostomy. Mild proximal sigmoid diverticulitis. No evidence of perforation or abscess. Left lower quadrant colostomy. 3.2 cm posterior uterine fibroid. 1.5 cm benign-appearing right ovarian cyst. No follow-up imaging recommended. Note: This recommendation does not apply to premenarchal patients and to those with increased risk (genetic, family history, elevated tumor markers or other high-risk factors) of ovarian cancer. Reference: JACR 2020 Feb; 17(2):248-254 Electronically Signed   By: Danae Orleans M.D.   On: 06/15/2023 15:02   MM LT BREAST BX W LOC DEV 1ST LESION IMAGE BX SPEC STEREO GUIDE  Addendum Date: 06/05/2023   ADDENDUM REPORT: 06/05/2023 08:33 ADDENDUM: PATHOLOGY revealed: Site Breast, LEFT, needle core biopsy, UOQ - DUCTAL CARCINOMA IN SITU, INTERMEDIATE GRADE NECROSIS: NOT IDENTIFIED - CALCIFICATIONS: NOT IDENTIFIED - DCIS LENGTH: 0.6 CM Pathology results are CONCORDANT with imaging findings, per Dr. Baird Lyons. Pathology results and  recommendations below were discussed with patient by telephone on 06/04/2023. Patient reported biopsy site with slight tenderness at the site. Post biopsy care instructions were reviewed, questions were answered and my direct phone number was provided to patient. Patient was instructed to call the [facility] if any concerns or questions arise related to the biopsy. RECOMMENDATION: Surgical consultation has been arranged for patient to see Dr. Luisa Hart at [facility] on 06/17/2023. Pathology results reported by Lynett Grimes, RN on 06/04/2023. Electronically Signed   By: Baird Lyons M.D.   On: 06/05/2023 08:33   Result Date: 06/05/2023 CLINICAL DATA:  Left breast distortion. EXAM: LEFT BREAST STEREOTACTIC CORE NEEDLE BIOPSY COMPARISON:  Previous exam(s). FINDINGS: The patient and I discussed the procedure of stereotactic-guided biopsy including benefits and alternatives. We discussed the high likelihood of a successful procedure. We discussed the risks of the procedure including infection, bleeding, tissue injury, clip migration, and inadequate sampling. Informed written consent was given. The usual time out protocol was performed immediately prior to the procedure. Using sterile technique and 1% lidocaine and 1% lidocaine with epinephrine as local anesthetic, under stereotactic guidance, a 9 gauge vacuum assisted device was used to perform core needle biopsy of distortion in the upper-outer quadrant of the left breast using a superior to inferior approach. Lesion quadrant: Upper-outer quadrant At the conclusion of the procedure, X shaped tissue marker clip was deployed into the biopsy cavity. Follow-up 2-view mammogram was performed and dictated separately. IMPRESSION: Stereotactic-guided biopsy of the left breast. No apparent complications. Electronically Signed: By: Baird Lyons M.D. On: 06/03/2023 10:35   MM CLIP PLACEMENT LEFT  Result Date: 06/03/2023 CLINICAL DATA:  Status  post stereotactic biopsy of left breast distortion. EXAM: 3D DIAGNOSTIC LEFT MAMMOGRAM POST STEREOTACTIC BIOPSY COMPARISON:  Previous exam(s). FINDINGS: 3D Mammographic images were obtained following stereotactic guided biopsy of the left breast. The biopsy marking clip is in expected location in the upper-outer quadrant of the left breast. IMPRESSION: Appropriate positioning of the X shaped biopsy marking clip at the site of biopsy in the upper-outer quadrant of the left breast. Final Assessment: Post Procedure Mammograms for Marker Placement Electronically Signed   By: Baird Lyons M.D.   On: 06/03/2023 10:47       IMPRESSION/PLAN: 1. Intermediate grade ER/PR positive DCIS of the left breast.  Dr. Mitzi Hansen discusses the pathology findings and reviews the nature of left  breast disease. The consensus from the breast conference includes breast conservation with lumpectomy.  Dr. Mitzi Hansen recommends external radiotherapy to the breast  to reduce risks of local recurrence followed by antiestrogen therapy. We discussed the  risks, benefits, short, and long term effects of radiotherapy, as well as the curative intent, and the patient is interested in proceeding. Dr. Mitzi Hansen discusses the delivery and logistics of radiotherapy and anticipates a course of 4 weeks of radiotherapy to the left breast with deep inspiration breath-hold technique. We will see her back a few weeks after surgery to discuss the simulation process and anticipate we starting radiotherapy about 4-6 weeks after surgery.  2. Possible genetic predisposition to malignancy. The patient is a candidate for genetic testing given her personal and family history. She will meet with our geneticist which can be scheduled at her convenience. A referral was placed today.   In a visit lasting 60 minutes, greater than 50% of the time was spent face to face reviewing her case, as well as in preparation of, discussing, and coordinating the patient's care.  The above  documentation reflects my direct findings during this shared patient visit. Please see the separate note by Dr. Mitzi Hansen on this date for the remainder of the patient's plan of care.    Osker Mason, [facility]    **Disclaimer: This note was dictated with voice recognition software. Similar sounding words can inadvertently be transcribed and this note may contain transcription errors which may not have been corrected upon publication of note.**

## 2023-06-26 NOTE — Pre-Procedure Instructions (Signed)
Surgical Instructions   Your procedure is scheduled on July 04, 2023. Report to [facility] Main Entrance "A" at 11:30 A.M., then check in with the Admitting office. Any questions or running late day of surgery: call [phone]  Questions prior to your surgery date: call [phone], Monday-Friday, 8am-4pm. If you experience any cold or flu symptoms such as cough, fever, chills, shortness of breath, etc. between now and your scheduled surgery, please notify us at the above number.     Remember:  Do not eat after midnight the night before your surgery   You may drink clear liquids until 10:30 AM the morning of your surgery.   Clear liquids allowed are: Water, Non-Citrus Juices (without pulp), Carbonated Beverages, Clear Tea, Black Coffee Only (NO MILK, CREAM OR POWDERED CREAMER of any kind), and Gatorade.    Take these medicines the morning of surgery with A SIP OF WATER: amLODipine (NORVASC)  carvedilol (COREG)  pantoprazole (PROTONIX)  TRELEGY ELLIPTA inhaler    One week prior to surgery, STOP taking any Aspirin (unless otherwise instructed by your surgeon) Aleve, Naproxen, Ibuprofen, Motrin, Advil, Goody's, BC's, all herbal medications, fish oil, and non-prescription vitamins.                     Do NOT Smoke (Tobacco/Vaping) for 24 hours prior to your procedure.  If you use a CPAP at night, you may bring your mask/headgear for your overnight stay.   You will be asked to remove any contacts, glasses, piercing's, hearing aid's, dentures/partials prior to surgery. Please bring cases for these items if needed.    Patients discharged the day of surgery will not be allowed to drive home, and someone needs to stay with them for 24 hours.  SURGICAL WAITING ROOM VISITATION Patients may have no more than 2 support people in the waiting area - these visitors may rotate.   Pre-op nurse will coordinate an appropriate time for 1 ADULT support person, who may not rotate, to accompany  patient in pre-op.  Children under the age of 64 must have an adult with them who is not the patient and must remain in the main waiting area with an adult.  If the patient needs to stay at the hospital during part of their recovery, the visitor guidelines for inpatient rooms apply.  Please refer to the [facility] website for the visitor guidelines for any additional information.   If you received a COVID test during your pre-op visit  it is requested that you wear a mask when out in public, stay away from anyone that may not be feeling well and notify your surgeon if you develop symptoms. If you have been in contact with anyone that has tested positive in the last 10 days please notify you surgeon.      Pre-operative CHG Bathing Instructions   You can play a key role in reducing the risk of infection after surgery. Your skin needs to be as free of germs as possible. You can reduce the number of germs on your skin by washing with CHG (chlorhexidine gluconate) soap before surgery. CHG is an antiseptic soap that kills germs and continues to kill germs even after washing.   DO NOT use if you have an allergy to chlorhexidine/CHG or antibacterial soaps. If your skin becomes reddened or irritated, stop using the CHG and notify one of our RNs at [phone].              TAKE A SHOWER THE  NIGHT BEFORE SURGERY AND THE DAY OF SURGERY    Please keep in mind the following:  DO NOT shave, including legs and underarms, 48 hours prior to surgery.   You may shave your face before/day of surgery.  Place clean sheets on your bed the night before surgery Use a clean washcloth (not used since being washed) for each shower. DO NOT sleep with pet's night before surgery.  CHG Shower Instructions:  If you choose to wash your hair and private area, wash first with your normal shampoo/soap.  After you use shampoo/soap, rinse your hair and body thoroughly to remove shampoo/soap residue.  Turn the water OFF  and apply half the bottle of CHG soap to a CLEAN washcloth.  Apply CHG soap ONLY FROM YOUR NECK DOWN TO YOUR TOES (washing for 3-5 minutes)  DO NOT use CHG soap on face, private areas, open wounds, or sores.  Pay special attention to the area where your surgery is being performed.  If you are having back surgery, having someone wash your back for you may be helpful. Wait 2 minutes after CHG soap is applied, then you may rinse off the CHG soap.  Pat dry with a clean towel  Put on clean pajamas    Additional instructions for the day of surgery: DO NOT APPLY any lotions, deodorants, cologne, or perfumes.   Do not wear jewelry or makeup Do not wear nail polish, gel polish, artificial nails, or any other type of covering on natural nails (fingers and toes) Do not bring valuables to the hospital. [facility] is not responsible for valuables/personal belongings. Put on clean/comfortable clothes.  Please brush your teeth.  Ask your nurse before applying any prescription medications to the skin.

## 2023-06-26 NOTE — Progress Notes (Signed)
New Breast Cancer Diagnosis:Left Breast UOQ  Did patient present with symptoms (if so, please note symptoms) or screening mammography?: Screening mammogram noted possible asymmetry in the left breast.    Location and Extent of disease : Located in the upper outer quadrant of the left breast, measured 0.6 cm in greatest dimension. Adenopathy no.  Histology per Pathology Report: grade 2, DCIS 06/03/2023  Receptor Status: ER(positive), PR (positive), Her2-neu (), Ki-(%)   Surgeon and surgical plan, if any:  Dr. Luisa Hart - Left Breast Lumpectomy with radioactive seed localization 07/04/2023   Medical oncologist, treatment if any:   Dr. Pamelia Hoit 07/03/2023   Family History of Breast/Ovarian/Prostate Cancer:   Lymphedema issues, if any:      Pain issues, if any:     SAFETY ISSUES: Prior radiation? No Pacemaker/ICD? No Possible current pregnancy? Postmenopausal Is the patient on methotrexate? No  Current Complaints / other details:

## 2023-06-27 ENCOUNTER — Encounter (HOSPITAL_COMMUNITY): Payer: Self-pay

## 2023-06-27 ENCOUNTER — Encounter (HOSPITAL_COMMUNITY)
Admission: RE | Admit: 2023-06-27 | Discharge: 2023-06-27 | Disposition: A | Discharge status: Home / Self Care | Payer: Medicare Other | Source: Ambulatory Visit | Attending: Surgery | Admitting: Surgery

## 2023-06-27 ENCOUNTER — Ambulatory Visit
Admission: RE | Admit: 2023-06-27 | Discharge: 2023-06-27 | Disposition: A | Discharge status: Home / Self Care | Payer: Medicare Other | Source: Ambulatory Visit | Attending: Radiation Oncology | Admitting: Radiation Oncology

## 2023-06-27 ENCOUNTER — Other Ambulatory Visit: Payer: Self-pay

## 2023-06-27 VITALS — BP 156/78 | HR 60 | Temp 98.2°F | Resp 17 | Ht 69.0 in | Wt 171.2 lb

## 2023-06-27 DIAGNOSIS — I10 Essential (primary) hypertension: Secondary | ICD-10-CM | POA: Insufficient documentation

## 2023-06-27 DIAGNOSIS — Z01818 Encounter for other preprocedural examination: Secondary | ICD-10-CM | POA: Diagnosis present

## 2023-06-27 DIAGNOSIS — Z01810 Encounter for preprocedural cardiovascular examination: Secondary | ICD-10-CM | POA: Diagnosis not present

## 2023-06-27 DIAGNOSIS — D0512 Intraductal carcinoma in situ of left breast: Secondary | ICD-10-CM

## 2023-06-27 DIAGNOSIS — Z01812 Encounter for preprocedural laboratory examination: Secondary | ICD-10-CM | POA: Insufficient documentation

## 2023-06-27 HISTORY — DX: Gastro-esophageal reflux disease without esophagitis: K21.9

## 2023-06-27 LAB — CBC
HCT: 42.5 % (ref 36.0–46.0)
Hemoglobin: 14.3 g/dL (ref 12.0–15.0)
MCH: 31.7 pg (ref 26.0–34.0)
MCHC: 33.6 g/dL (ref 30.0–36.0)
MCV: 94.2 fL (ref 80.0–100.0)
Platelets: 272 10*3/uL (ref 150–400)
RBC: 4.51 MIL/uL (ref 3.87–5.11)
RDW: 13 % (ref 11.5–15.5)
WBC: 9 10*3/uL (ref 4.0–10.5)
nRBC: 0 % (ref 0.0–0.2)

## 2023-06-27 LAB — BASIC METABOLIC PANEL
Anion gap: 11 (ref 5–15)
BUN: 16 mg/dL (ref 8–23)
CO2: 29 mmol/L (ref 22–32)
Calcium: 9.7 mg/dL (ref 8.9–10.3)
Chloride: 97 mmol/L — ABNORMAL LOW (ref 98–111)
Creatinine, Ser: 0.97 mg/dL (ref 0.44–1.00)
GFR, Estimated: >60 mL/min (ref >60)
Glucose, Bld: 126 mg/dL — ABNORMAL HIGH (ref 70–99)
Potassium: 3.5 mmol/L (ref 3.5–5.1)
Sodium: 137 mmol/L (ref 135–145)

## 2023-06-27 NOTE — Progress Notes (Signed)
PCP - Dr. Doreen Beam Cardiologist - denies  PPM/ICD - denies   Chest x-ray - 09/29/21 EKG - 06/27/23 Stress Test - 01/12/16 ECHO - denies Cardiac Cath - denies  Sleep Study - denies   DM- denies  ASA/Blood Thinner Instructions: n/a   ERAS Protcol - yes, no drink   COVID TEST- n/a   Anesthesia review: yes, breast seed placement  Patient denies shortness of breath, fever, cough and chest pain at PAT appointment   All instructions explained to the patient, with a verbal understanding of the material. Patient agrees to go over the instructions while at home for a better understanding.  The opportunity to ask questions was provided.

## 2023-07-01 NOTE — H&P (Signed)
History of Present Illness: Glenda Hill is a 67 y.o. 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 Area left breast which appears central measuring about a centimeter of distortion. Core biopsy showed intermediate grade DCIS. No history of breast pain, breast mass or nipple discharge. This is her first mammogram. No family history of breast cancer or breast related problems.  History of laparotomy and need not for diverticulitis now has an ostomy. Is seeing Dr. Michaell Cowing about reversal of this in the near future.   Review of Systems: A complete review of systems was obtained from the patient. I have reviewed this information and discussed as appropriate with the patient. See HPI as well for other ROS.    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)  Tobacco abuse   Past Surgical History:  Procedure Laterality Date  colostomy bag  SPINE SURGERY    No Known Allergies  Current Outpatient Medications on File Prior to Visit  Medication Sig Dispense Refill  amLODIPine (NORVASC) 10 MG tablet Take 10 mg by mouth once daily  bisacodyL (DULCOLAX) 5 mg EC tablet Take 4 tablets (20 mg total) by mouth once daily as needed for Constipation for up to 1 dose 4 tablet 0  candesartan (ATACAND) 16 MG tablet Take 16 mg by mouth once daily  carvediloL (COREG) 12.5 MG tablet Take 12.5 mg by mouth 2 (two) times daily  pantoprazole (PROTONIX) 40 MG DR tablet Take 40 mg by mouth once daily  TRELEGY ELLIPTA 100-62.5-25 mcg inhaler Inhale 1 Puff into the lungs once daily   No current  facility-administered medications on file prior to visit.   Family History  Problem Relation Age of Onset  High blood pressure (Hypertension) Mother  Diabetes Father    Social History   Tobacco Use  Smoking Status Every Day  Current packs/day: 1.00  Types: Cigarettes  Smokeless Tobacco Never    Social History   Socioeconomic History  Marital status: Divorced  Tobacco Use  Smoking status: Every Day  Current packs/day: 1.00  Types: Cigarettes  Smokeless tobacco: Never  Vaping Use  Vaping status: Never Used  Substance and Sexual Activity  Drug use: Never   Objective:   Vitals:  06/17/23 0946  BP: 132/82  Pulse: 73  Temp: 36.4 C (97.5 F)  SpO2: 97%  Weight: 81.6 kg (180 lb)  Height: 170.2 cm (5\' 7" )  PainSc: 0-No pain   Body mass index is 28.19 kg/m.  Physical Exam HENT:  Head: Normocephalic.  Cardiovascular:  Rate and Rhythm: Normal rate.  Pulmonary:  Effort: Pulmonary effort is normal.  Chest:  Breasts: Right: Normal. No mass.  Left: Normal. No mass.   Comments: Bruising left breast  Lymphadenopathy:  Upper Body:  Right upper body: No axillary adenopathy.  Left upper body: No axillary adenopathy.  Skin: General: Skin is warm.  Neurological:  Mental Status: She is alert.  Psychiatric:  Mood and Affect: Mood normal.     Labs, Imaging and Diagnostic Testing: COMMENDATION: Recommend stereotactic guided biopsy of the distortion seen in the upper-outer left breast at mammography. I have discussed the findings and recommendations with the patient. If applicable, a reminder letter  will be sent to the patient regarding the next appointment. BI-RADS CATEGORY 4: Suspicious. Electronically Signed By: Edwin Cap M.D. On: 05/22/2023 16:34 Narrative  CLINICAL DATA: Screening recall from baseline mammography for possible right breast asymmetries and possible left breast asymmetry and distortion. EXAM: DIGITAL DIAGNOSTIC BILATERAL MAMMOGRAM  WITH TOMOSYNTHESIS AND CAD; ULTRASOUND LEFT BREAST LIMITED TECHNIQUE: Bilateral digital diagnostic mammography and breast tomosynthesis was performed. The images were evaluated with computer-aided detection. ; Targeted ultrasound examination of the left breast was performed. COMPARISON: Previous exam(s). ACR Breast Density Category c: The breasts are heterogeneously dense, which may obscure small masses. FINDINGS: Additional tomograms were performed of the bilateral breasts. The initially questioned possible right breast asymmetry resolves on the additional imaging with findings compatible with an area of overlapping fibroglandular tissue. An initially questioned asymmetry seen in the central to slightly inner left breast on the CC view only is also felt to resolve on the additional imaging. There is a persistent area of distortion in the upper-outer left breast. Targeted ultrasound of the left breast was performed. Multiple vague areas of shadowing are identified in the upper-outer left breast however no definite reproducible sonographic correlate for the distortion seen in the upper-outer left breast at mammography. No lymphadenopathy seen in the left axilla. Bilateral axillary lymph nodes are symmetric and felt to be within normal limits for this patient. ADDENDUM REPORT: 06/05/2023 08:33  ADDENDUM: PATHOLOGY revealed: Site Breast, LEFT, needle core biopsy, UOQ - DUCTAL CARCINOMA IN SITU, INTERMEDIATE GRADE  NECROSIS: NOT IDENTIFIED - CALCIFICATIONS: NOT IDENTIFIED - DCIS LENGTH: 0.6 CM  Pathology results are CONCORDANT with imaging findings, per Dr. Baird Lyons.  Pathology results and recommendations below were discussed with patient by telephone on 06/04/2023. Patient reported biopsy site with slight tenderness at the site. Post biopsy care instructions were reviewed, questions were answered and my direct phone number was provided to patient. Patient was instructed to call  the [facility] if any concerns or questions arise related to the biopsy.  RECOMMENDATION: Surgical consultation has been arranged for patient to see Dr. Luisa Hart at [facility] on 06/17/2023.  Pathology results reported by Lynett Grimes, RN on 06/04/2023.  Diagnosis Breast, left, needle core biopsy, UOQ - DUCTAL CARCINOMA IN SITU, INTERMEDIATE GRADE - NECROSIS: NOT IDENTIFIED - CALCIFICATIONS: NOT IDENTIFIED - DCIS LENGTH: 0.6 CM Diagnosis Note Dr. [facility] Charles Callas reviewed the case and concurs with the diagnosis. A breast prognostic profile (ER and PR) is pending and will be reported in an addendum. The [facility] was notified on 06/04/2023. Holley Bouche MD Pathologist, Electronic Signature (Case signed 06/04/2023)  Assessment and Plan:   Diagnoses and all orders for this visit:  Ductal carcinoma in situ (DCIS) of left breast - Ambulatory Referral to Oncology-Medical - Ambulatory Referral to Radiation Oncology   Discussed breast conserving surgery versus mastectomy with or without reconstruction. She is opted for left breast seed localized lumpectomy. Make referral to medical radiation oncology  Discussed the pros and cons of breast conserving surgery, long-term survival, local regional recurrence and quality of life. Of note she has a ostomy that needs to be reversed at some point and that can be done later time.  Cautioned her about the dangers of smoking wound complications especially breast surgery  Risk of bleeding, infection, cosmetic deformity, pain, poor wound healing, need for reexcision, and need for other treatments and/or procedures. Also discussed potential complications of exacerbation of her COPD and other underlying medical problems.  She wishes to  have care here in [facility] therefore I will make a referral to medical and radiation oncology    Juliann Pares, MD

## 2023-07-02 ENCOUNTER — Ambulatory Visit
Admission: RE | Admit: 2023-07-02 | Discharge: 2023-07-02 | Disposition: A | Discharge status: Home / Self Care | Payer: Medicare Other | Source: Ambulatory Visit | Attending: Surgery | Admitting: Surgery

## 2023-07-02 DIAGNOSIS — D0512 Intraductal carcinoma in situ of left breast: Secondary | ICD-10-CM

## 2023-07-02 HISTORY — PX: BREAST BIOPSY: SHX20

## 2023-07-03 ENCOUNTER — Other Ambulatory Visit: Payer: Self-pay

## 2023-07-03 ENCOUNTER — Inpatient Hospital Stay: Payer: Medicare Other

## 2023-07-03 ENCOUNTER — Other Ambulatory Visit: Payer: Self-pay | Admitting: Genetic Counselor

## 2023-07-03 ENCOUNTER — Inpatient Hospital Stay: Payer: Medicare Other | Admitting: Genetic Counselor

## 2023-07-03 ENCOUNTER — Inpatient Hospital Stay: Payer: Medicare Other | Attending: Hematology and Oncology | Admitting: Hematology and Oncology

## 2023-07-03 ENCOUNTER — Encounter: Payer: Self-pay | Admitting: Internal Medicine

## 2023-07-03 ENCOUNTER — Encounter: Payer: Self-pay | Admitting: Genetic Counselor

## 2023-07-03 VITALS — BP 143/75 | HR 65 | Temp 97.8°F | Resp 18 | Ht 69.0 in | Wt 181.3 lb

## 2023-07-03 DIAGNOSIS — Z801 Family history of malignant neoplasm of trachea, bronchus and lung: Secondary | ICD-10-CM | POA: Diagnosis not present

## 2023-07-03 DIAGNOSIS — D0512 Intraductal carcinoma in situ of left breast: Secondary | ICD-10-CM

## 2023-07-03 DIAGNOSIS — Z8041 Family history of malignant neoplasm of ovary: Secondary | ICD-10-CM | POA: Diagnosis not present

## 2023-07-03 DIAGNOSIS — F17210 Nicotine dependence, cigarettes, uncomplicated: Secondary | ICD-10-CM | POA: Diagnosis not present

## 2023-07-03 LAB — GENETIC SCREENING ORDER

## 2023-07-03 NOTE — Progress Notes (Signed)
[facility] CONSULT NOTE  Patient Care Team: Ignatius Specking, MD as PCP - General (Internal Medicine) Shelly Coss, MD as Referring Physician (Surgery) Wendall Stade, MD as Consulting Physician (Cardiology) Nyoka Cowden, MD as Consulting Physician (Pulmonary Disease) Shirlean Kelly, MD as Attending Physician (Neurosurgery) Karie Soda, MD as Consulting Physician (Colon and Rectal Surgery) Dolores Frame, MD as Consulting Physician (Gastroenterology) Pershing Proud, RN as Oncology Nurse Navigator Donnelly Angelica, RN as Oncology Nurse Navigator Serena Croissant, MD as Consulting Physician (Hematology and Oncology) Harriette Bouillon, MD as Consulting Physician (General Surgery) Dorothy Puffer, MD as Consulting Physician (Radiation Oncology)  CHIEF COMPLAINTS/PURPOSE OF CONSULTATION:  Newly diagnosed left breast DCIS  HISTORY OF PRESENTING ILLNESS:  Glenda Hill 67 y.o. female is here because of recent diagnosis of left breast DCIS.  Patient had a routine screening mammogram that detected left breast distortion which measured 1 cm in size.  Biopsy revealed intermediate grade DCIS that is ER/PR positive.  She was seen by Dr. Luisa Hart who recommended lumpectomy and referred her for discussion regarding adjuvant treatment options.  Prior history of laparotomy for diverticulitis and now has an ostomy.  She sees Dr. Michaell Cowing  I reviewed her records extensively and collaborated the history with the patient.  SUMMARY OF ONCOLOGIC HISTORY: Oncology History  Ductal carcinoma in situ (DCIS) of left breast  06/03/2023 Initial Diagnosis   Screening mammogram detected left breast distortion 1 cm, biopsy revealed intermediate grade DCIS ER 100%, PR 60%   07/03/2023 Cancer Staging   Staging form: Breast, AJCC 8th Edition - Clinical: Stage 0 (cTis (DCIS), cN0, cM0, G2, ER+, PR+, HER2: Not Assessed) - Signed by Serena Croissant, MD on 07/03/2023 Stage prefix: Initial  diagnosis Histologic grading system: 3 grade system      MEDICAL HISTORY:  Past Medical History:  Diagnosis Date   Complication of anesthesia    trouble waking up after C section   Ductal carcinoma in situ (DCIS) of left breast 2024   GERD (gastroesophageal reflux disease)    Hypertension     SURGICAL HISTORY: Past Surgical History:  Procedure Laterality Date   BACK SURGERY     x2 2014 and 2018   BREAST BIOPSY Left 06/03/2023   MM LT BREAST BX W LOC DEV 1ST LESION IMAGE BX SPEC STEREO GUIDE 06/03/2023 [facility]   BREAST BIOPSY  07/02/2023   MM LT RADIOACTIVE SEED LOC MAMMO GUIDE 07/02/2023 [facility]   CESAREAN SECTION     TONSILLECTOMY     removed as a child    SOCIAL HISTORY: Social History   Socioeconomic History   Marital status: Significant Other    Spouse name: Not on file   Number of children: 1   Years of education: 11   Highest education level: Not on file  Occupational History   Not on file  Tobacco Use   Smoking status: Every Day    Current packs/day: 1.00    Average packs/day: 1 pack/day for 4.0 years (4.0 ttl pk-yrs)    Types: Cigarettes   Smokeless tobacco: Never   Tobacco comments:    "stopped for a while"   Vaping Use   Vaping status: Never Used  Substance and Sexual Activity   Alcohol use: Not Currently   Drug use: No   Sexual activity: Not on file  Other Topics Concern   Not on file  Social History Narrative   Not on file   Social Determinants of Health  Financial Resource Strain: Not on file  Food Insecurity: Not on file  Transportation Needs: Not on file  Physical Activity: Not on file  Stress: Not on file  Social Connections: Not on file  Intimate Partner Violence: Not on file    FAMILY HISTORY: Family History  Problem Relation Age of Onset   Heart disease Mother    Lung cancer Mother        smoked   Emphysema Mother        smoked   Arthritis Unknown    Lung disease Unknown    Diabetes Unknown     Ovarian cancer Sister     ALLERGIES:  has No Known Allergies.  MEDICATIONS:  Current Outpatient Medications  Medication Sig Dispense Refill   amLODipine (NORVASC) 10 MG tablet Take 10 mg by mouth daily.     Calcium Carb-Cholecalciferol 600-20 MG-MCG TABS Take 600 mg by mouth 2 (two) times daily.     candesartan-hydrochlorothiazide (ATACAND HCT) 32-12.5 MG tablet Take 1 tablet by mouth daily.     carvedilol (COREG) 12.5 MG tablet Take 12.5 mg by mouth 2 (two) times daily.     ibuprofen (ADVIL,MOTRIN) 200 MG tablet Take 400 mg by mouth every 6 (six) hours as needed for headache (pain).     Multiple Vitamin (MULTIVITAMIN WITH MINERALS) TABS tablet Take 1 tablet by mouth daily. Woman     pantoprazole (PROTONIX) 40 MG tablet Take 40 mg by mouth daily.     TRELEGY ELLIPTA 100-62.5-25 MCG/ACT AEPB Inhale 1 puff into the lungs daily.     Vitamin D, Ergocalciferol, (DRISDOL) 1.25 MG (50000 UNIT) CAPS capsule Take 50,000 Units by mouth once a week.     No current facility-administered medications for this visit.    REVIEW OF SYSTEMS:   Constitutional: Denies fevers, chills or abnormal night sweats Breast:  Denies any palpable lumps or discharge All other systems were reviewed with the patient and are negative.  PHYSICAL EXAMINATION: ECOG PERFORMANCE STATUS: 1 - Symptomatic but completely ambulatory  Vitals:   07/03/23 1214  BP: (!) 143/75  Pulse: 65  Resp: 18  Temp: 97.8 F (36.6 C)  SpO2: 97%   Filed Weights   07/03/23 1214  Weight: 181 lb 4.8 oz (82.2 kg)    GENERAL:alert, no distress and comfortable    LABORATORY DATA:  I have reviewed the data as listed Lab Results  Component Value Date   WBC 9.0 06/27/2023   HGB 14.3 06/27/2023   HCT 42.5 06/27/2023   MCV 94.2 06/27/2023   PLT 272 06/27/2023   Lab Results  Component Value Date   NA 137 06/27/2023   K 3.5 06/27/2023   CL 97 (L) 06/27/2023   CO2 29 06/27/2023    RADIOGRAPHIC STUDIES: I have personally  reviewed the radiological reports and agreed with the findings in the report.  ASSESSMENT AND PLAN:  Ductal carcinoma in situ (DCIS) of left breast 06/03/2023:Screening mammogram detected left breast distortion 1 cm, biopsy revealed intermediate grade DCIS ER 100%, PR 60%  Pathology review: I discussed with the patient the difference between DCIS and invasive breast cancer. It is considered a precancerous lesion. DCIS is classified as a 0. It is generally detected through mammograms as calcifications. We discussed the significance of grades and its impact on prognosis. We also discussed the importance of ER and PR receptors and their implications to adjuvant treatment options. Prognosis of DCIS dependence on grade, comedo necrosis. It is anticipated that if not treated, 20-30% of  DCIS can develop into invasive breast cancer.  Recommendation: 1. Breast conserving surgery 2. Followed by adjuvant radiation therapy (debating whether to do radiation at [facility] or at [place] [facility].  I discussed with him about breath-hold technique) their concern is the number of trips that they have to make. 3. Followed by antiestrogen therapy with tamoxifen 5 years  Tamoxifen counseling: We discussed the risks and benefits of tamoxifen. These include but not limited to insomnia, hot flashes, mood changes, vaginal dryness, and weight gain. Although rare, serious side effects including endometrial cancer, risk of blood clots were also discussed. We strongly believe that the benefits far outweigh the risks. Patient understands these risks and consented to starting treatment. Planned treatment duration is 5 years.  Telephone visit 10 days after surgery to discuss final pathology report and at that time she will make a final decision on where she wants radiation.   All questions were answered. The patient knows to call the clinic with any problems, questions or concerns.    Tamsen Meek, MD 07/03/23

## 2023-07-03 NOTE — Assessment & Plan Note (Addendum)
06/03/2023:Screening mammogram detected left breast distortion 1 cm, biopsy revealed intermediate grade DCIS ER 100%, PR 60%  Pathology review: I discussed with the patient the difference between DCIS and invasive breast cancer. It is considered a precancerous lesion. DCIS is classified as a 0. It is generally detected through mammograms as calcifications. We discussed the significance of grades and its impact on prognosis. We also discussed the importance of ER and PR receptors and their implications to adjuvant treatment options. Prognosis of DCIS dependence on grade, comedo necrosis. It is anticipated that if not treated, 20-30% of DCIS can develop into invasive breast cancer.  Recommendation: 1. Breast conserving surgery 2. Followed by adjuvant radiation therapy (debating whether to do radiation at [facility] or at [place] [facility].  I discussed with him about breath-hold technique) their concern is the number of trips that they have to make. 3. Followed by antiestrogen therapy with tamoxifen 5 years  Tamoxifen counseling: We discussed the risks and benefits of tamoxifen. These include but not limited to insomnia, hot flashes, mood changes, vaginal dryness, and weight gain. Although rare, serious side effects including endometrial cancer, risk of blood clots were also discussed. We strongly believe that the benefits far outweigh the risks. Patient understands these risks and consented to starting treatment. Planned treatment duration is 5 years.  Telephone visit 10 days after surgery to discuss final pathology report and at that time she will make a final decision on where she wants radiation.

## 2023-07-03 NOTE — Progress Notes (Signed)
REFERRING PROVIDER: Ronny Bacon, PA-C [address] [place],  [place] [address]  PRIMARY PROVIDER:  Ignatius Specking, MD  PRIMARY REASON FOR VISIT:  1. Ductal carcinoma in situ (DCIS) of left breast   2. Family history of ovarian cancer      HISTORY OF PRESENT ILLNESS:   Ms. Freitas, a 67 y.o. female, was seen for a [place] cancer genetics consultation at the request of Laurence Aly, PA-C due to a personal history of breast cancer and family history of ovarian cancer.  Ms. Bittner presents to clinic today to discuss the possibility of a hereditary predisposition to cancer, to discuss genetic testing, and to further clarify her future cancer risks, as well as potential cancer risks for family members.   In 2024, at the age of 48, Ms. 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 The treatment plan includes breast conserving surgery, adjuvant radiation, and anti-estrogens.   CANCER HISTORY:  Oncology History  Ductal carcinoma in situ (DCIS) of left breast  06/03/2023 Initial Diagnosis   Screening mammogram detected left breast distortion 1 cm, biopsy revealed intermediate grade DCIS ER 100%, PR 60%   07/03/2023 Cancer Staging   Staging form: Breast, AJCC 8th Edition - Clinical: Stage 0 (cTis (DCIS), cN0, cM0, G2, ER+, PR+, HER2: Not Assessed) - Signed by Serena Croissant, MD on 07/03/2023 Stage prefix: Initial diagnosis Histologic grading system: 3 grade system      RISK FACTORS:  Hysterectomy: no.  Ovaries intact: yes.  No dermatology screening.   Past Medical History:  Diagnosis Date   Complication of anesthesia    trouble waking up after C section   Ductal carcinoma in situ (DCIS) of left breast 2024   GERD (gastroesophageal reflux disease)    Hypertension     Past Surgical History:  Procedure Laterality Date   BACK SURGERY     x2 2014 and 2018   BREAST BIOPSY Left 06/03/2023   MM LT BREAST BX W LOC DEV 1ST LESION IMAGE  BX SPEC STEREO GUIDE 06/03/2023 [facility]   BREAST BIOPSY  07/02/2023   MM LT RADIOACTIVE SEED LOC MAMMO GUIDE 07/02/2023 [facility]   CESAREAN SECTION     TONSILLECTOMY     removed as a child    FAMILY HISTORY:  We obtained a detailed, 4-generation family history.  Significant diagnoses are listed below: Family History  Problem Relation Age of Onset   Lung cancer Mother 74       smoking hx   Emphysema Mother        smoking hx   Ovarian cancer Sister        dx 31-50   Cervical cancer Paternal Grandmother        dx unknown age   Cancer Cousin 81       stomach and lung; pat female cousin    Ms. Sas is unaware of previous family history of genetic testing for hereditary cancer risks. There is no reported Ashkenazi Jewish ancestry. There is no known consanguinity.  GENETIC COUNSELING ASSESSMENT: Ms. Posten is a 67 y.o. female with a personal and family history which is somewhat suggestive of a hereditary cancer syndrome and predisposition to cancer given the presence of related cancers in the family. We, therefore, discussed and recommended the following at today's visit.   DISCUSSION: We discussed that 5 - 10% of cancer is hereditary.  Most cases of hereditary breast and ovarian cancer are associated with mutations in  BRCA1/2.  There are other genes that can be associated with hereditary breast and ovarian cancer syndromes.  We discussed that testing is beneficial for several reasons including knowing how to follow individuals for their cancer risks and understanding if other family members could be at risk for cancer and allowing them to undergo genetic testing.   We reviewed the characteristics, features and inheritance patterns of hereditary cancer syndromes. We also discussed genetic testing, including the appropriate family members to test, the process of testing, insurance coverage and turn-around-time for results. We discussed the implications of a negative,  positive, carrier and/or variant of uncertain significant result. We recommended Ms. Weninger pursue genetic testing for a panel that includes genes associated with breast, ovarian, gastric, and other cancers.    The [facility] Common Hereditary Cancers + RNA Panel includes sequencing, deletion/duplication, and RNA analysis of the following 48 genes: APC, ATM, AXIN2, BAP1, BARD1, BMPR1A, BRCA1, BRCA2, BRIP1, CDH1, CDK4*, CDKN2A*, CHEK2, CTNNA1, DICER1, EPCAM* (del/dup only), FH, GREM1* (promoter dup analysis only), HOXB13*, KIT*, MBD4*, MEN1, MLH1, MSH2, MSH3, MSH6, MUTYH, NF1, NTHL1, PALB2, PDGFRA*, PMS2, POLD1, POLE, PTEN, RAD51C, RAD51D, SDHA (sequencing only), SDHB, SDHC, SDHD, SMAD4, SMARCA4, STK11, TP53, TSC1, TSC2, VHL.  *Genes without RNA analysis.   Based on Ms. Kassab's personal history of breast cancer and her sister's history of ovarian cancer, she meets medical criteria for genetic testing.   PLAN: After considering the risks, benefits, and limitations, Ms. Kalata provided informed consent to pursue genetic testing and the blood sample was sent to [facility] for analysis of the Common Hereditary Cancers +RNA Panel. Results should be available within approximately 3  weeks' time, at which point they will be disclosed by telephone to Ms. Pilgreen, as will any additional recommendations warranted by these results. Ms. Salters will receive a summary of her genetic counseling visit and a copy of her results once available. This information will also be available in Epic.   Ms. Pless questions were answered to her satisfaction today. Our contact information was provided should additional questions or concerns arise. Thank you for the referral and allowing [place] to share in the care of your patient.    M. Rennie Plowman, MS, [facility] Genetic Counselor [email or web address] (P) [phone]  The patient was seen for a total of 35 minutes in face-to-face genetic counseling.  The  patient was accompanied by her partner. Dr. Pamelia Hoit was available to discuss this case as needed.    _______________________________________________________________________ For Office Staff:  Number of people involved in session: 2 Was an Intern/ student involved with case: no

## 2023-07-04 ENCOUNTER — Other Ambulatory Visit: Payer: Self-pay

## 2023-07-04 ENCOUNTER — Ambulatory Visit (HOSPITAL_BASED_OUTPATIENT_CLINIC_OR_DEPARTMENT_OTHER): Payer: Medicare Other | Admitting: Anesthesiology

## 2023-07-04 ENCOUNTER — Ambulatory Visit (HOSPITAL_COMMUNITY): Payer: Medicare Other | Admitting: Physician Assistant

## 2023-07-04 ENCOUNTER — Encounter (HOSPITAL_COMMUNITY)
Admission: RE | Disposition: A | Discharge status: Home / Self Care | Payer: Self-pay | Source: Home / Self Care | Attending: Surgery

## 2023-07-04 ENCOUNTER — Ambulatory Visit (HOSPITAL_COMMUNITY)
Admission: RE | Admit: 2023-07-04 | Discharge: 2023-07-04 | Disposition: A | Discharge status: Home / Self Care | Payer: Medicare Other | Attending: Surgery | Admitting: Surgery

## 2023-07-04 ENCOUNTER — Ambulatory Visit
Admission: RE | Admit: 2023-07-04 | Discharge: 2023-07-04 | Disposition: A | Discharge status: Home / Self Care | Payer: Medicare Other | Source: Ambulatory Visit | Attending: Surgery | Admitting: Surgery

## 2023-07-04 DIAGNOSIS — C50412 Malignant neoplasm of upper-outer quadrant of left female breast: Secondary | ICD-10-CM | POA: Diagnosis not present

## 2023-07-04 DIAGNOSIS — F17210 Nicotine dependence, cigarettes, uncomplicated: Secondary | ICD-10-CM | POA: Diagnosis not present

## 2023-07-04 DIAGNOSIS — Z170 Estrogen receptor positive status [ER+]: Secondary | ICD-10-CM | POA: Insufficient documentation

## 2023-07-04 DIAGNOSIS — E119 Type 2 diabetes mellitus without complications: Secondary | ICD-10-CM

## 2023-07-04 DIAGNOSIS — C50912 Malignant neoplasm of unspecified site of left female breast: Secondary | ICD-10-CM | POA: Diagnosis not present

## 2023-07-04 DIAGNOSIS — Z933 Colostomy status: Secondary | ICD-10-CM | POA: Diagnosis not present

## 2023-07-04 DIAGNOSIS — D0512 Intraductal carcinoma in situ of left breast: Secondary | ICD-10-CM

## 2023-07-04 DIAGNOSIS — I10 Essential (primary) hypertension: Secondary | ICD-10-CM | POA: Diagnosis not present

## 2023-07-04 HISTORY — PX: BREAST LUMPECTOMY WITH RADIOACTIVE SEED LOCALIZATION: SHX6424

## 2023-07-04 SURGERY — BREAST LUMPECTOMY WITH RADIOACTIVE SEED LOCALIZATION
Anesthesia: General | Laterality: Left

## 2023-07-04 MED ORDER — PROPOFOL 10 MG/ML IV BOLUS
INTRAVENOUS | Status: AC
  Filled 2023-07-04: qty 20

## 2023-07-04 MED ORDER — MIDAZOLAM HCL 2 MG/2ML IJ SOLN
INTRAMUSCULAR | Status: AC
  Filled 2023-07-04: qty 2

## 2023-07-04 MED ORDER — CEFAZOLIN SODIUM-DEXTROSE 2-4 GM/100ML-% IV SOLN: 2.0000 g | INTRAVENOUS | Status: DC

## 2023-07-04 MED ORDER — LACTATED RINGERS IV SOLN: INTRAVENOUS | Status: DC

## 2023-07-04 MED ORDER — BUPIVACAINE-EPINEPHRINE 0.25% -1:200000 IJ SOLN
INTRAMUSCULAR | Status: DC | PRN
  Administered 2023-07-04: 30 mL

## 2023-07-04 MED ORDER — OXYCODONE HCL 5 MG PO TABS: 5.0000 mg | ORAL_TABLET | Freq: Four times a day (QID) | ORAL | 0 refills | Status: DC | PRN

## 2023-07-04 MED ORDER — PROMETHAZINE HCL 25 MG/ML IJ SOLN: 6.2500 mg | INTRAMUSCULAR | Status: DC | PRN

## 2023-07-04 MED ORDER — FENTANYL CITRATE (PF) 250 MCG/5ML IJ SOLN
INTRAMUSCULAR | Status: AC
  Filled 2023-07-04: qty 5

## 2023-07-04 MED ORDER — CHLORHEXIDINE GLUCONATE 0.12 % MT SOLN
OROMUCOSAL | Status: AC
  Administered 2023-07-04: 15 mL via OROMUCOSAL
  Filled 2023-07-04: qty 15

## 2023-07-04 MED ORDER — PROPOFOL 10 MG/ML IV BOLUS
INTRAVENOUS | Status: DC | PRN
  Administered 2023-07-04: 200 mg via INTRAVENOUS

## 2023-07-04 MED ORDER — HYDROMORPHONE HCL 1 MG/ML IJ SOLN: 0.2500 mg | INTRAMUSCULAR | Status: DC | PRN

## 2023-07-04 MED ORDER — DEXAMETHASONE SODIUM PHOSPHATE 10 MG/ML IJ SOLN
INTRAMUSCULAR | Status: AC
  Filled 2023-07-04: qty 1

## 2023-07-04 MED ORDER — LIDOCAINE 2% (20 MG/ML) 5 ML SYRINGE
INTRAMUSCULAR | Status: AC
  Filled 2023-07-04: qty 5

## 2023-07-04 MED ORDER — CHLORHEXIDINE GLUCONATE 0.12 % MT SOLN: 15.0000 mL | Freq: Once | OROMUCOSAL | Status: AC

## 2023-07-04 MED ORDER — MIDAZOLAM HCL 2 MG/2ML IJ SOLN
INTRAMUSCULAR | Status: DC | PRN
  Administered 2023-07-04: 2 mg via INTRAVENOUS

## 2023-07-04 MED ORDER — LIDOCAINE HCL (CARDIAC) PF 100 MG/5ML IV SOSY
PREFILLED_SYRINGE | INTRAVENOUS | Status: DC | PRN
  Administered 2023-07-04: 40 mg via INTRATRACHEAL

## 2023-07-04 MED ORDER — CHLORHEXIDINE GLUCONATE CLOTH 2 % EX PADS: 6.0000 | MEDICATED_PAD | Freq: Once | CUTANEOUS | Status: DC

## 2023-07-04 MED ORDER — ACETAMINOPHEN 500 MG PO TABS: 1000.0000 mg | ORAL_TABLET | ORAL | Status: AC

## 2023-07-04 MED ORDER — MEPERIDINE HCL 25 MG/ML IJ SOLN: 6.2500 mg | INTRAMUSCULAR | Status: DC | PRN

## 2023-07-04 MED ORDER — ORAL CARE MOUTH RINSE: 15.0000 mL | Freq: Once | OROMUCOSAL | Status: AC

## 2023-07-04 MED ORDER — AMISULPRIDE (ANTIEMETIC) 5 MG/2ML IV SOLN: 10.0000 mg | Freq: Once | INTRAVENOUS | Status: DC | PRN

## 2023-07-04 MED ORDER — CEFAZOLIN SODIUM-DEXTROSE 2-4 GM/100ML-% IV SOLN
INTRAVENOUS | Status: AC
  Filled 2023-07-04: qty 100

## 2023-07-04 MED ORDER — HYDROMORPHONE HCL 1 MG/ML IJ SOLN
INTRAMUSCULAR | Status: AC
  Filled 2023-07-04: qty 0.5

## 2023-07-04 MED ORDER — ONDANSETRON HCL 4 MG/2ML IJ SOLN
INTRAMUSCULAR | Status: AC
  Filled 2023-07-04: qty 2

## 2023-07-04 MED ORDER — HYDROMORPHONE HCL 1 MG/ML IJ SOLN
INTRAMUSCULAR | Status: DC | PRN
  Administered 2023-07-04: .5 mg via INTRAVENOUS

## 2023-07-04 MED ORDER — FENTANYL CITRATE (PF) 250 MCG/5ML IJ SOLN
INTRAMUSCULAR | Status: DC | PRN
  Administered 2023-07-04: 25 ug via INTRAVENOUS
  Administered 2023-07-04: 50 ug via INTRAVENOUS

## 2023-07-04 MED ORDER — ACETAMINOPHEN 500 MG PO TABS
ORAL_TABLET | ORAL | Status: AC
  Administered 2023-07-04: 1000 mg via ORAL
  Filled 2023-07-04: qty 2

## 2023-07-04 MED ORDER — OXYCODONE HCL 5 MG PO TABS: 5.0000 mg | ORAL_TABLET | Freq: Once | ORAL | Status: DC | PRN

## 2023-07-04 MED ORDER — PROPOFOL 500 MG/50ML IV EMUL
INTRAVENOUS | Status: DC | PRN
  Administered 2023-07-04: 150 ug/kg/min via INTRAVENOUS

## 2023-07-04 MED ORDER — ONDANSETRON HCL 4 MG/2ML IJ SOLN
INTRAMUSCULAR | Status: DC | PRN
  Administered 2023-07-04: 4 mg via INTRAVENOUS

## 2023-07-04 MED ORDER — OXYCODONE HCL 5 MG/5ML PO SOLN: 5.0000 mg | Freq: Once | ORAL | Status: DC | PRN

## 2023-07-04 SURGICAL SUPPLY — 40 items
ADH SKN CLS APL DERMABOND .7 (GAUZE/BANDAGES/DRESSINGS) ×1
APL PRP STRL LF DISP 70% ISPRP (MISCELLANEOUS) ×1
APPLIER CLIP 9.375 MED OPEN (MISCELLANEOUS)
APR CLP MED 9.3 20 MLT OPN (MISCELLANEOUS)
BAG COUNTER SPONGE SURGICOUNT (BAG) ×1 IMPLANT
BAG SPNG CNTER NS LX DISP (BAG) ×1
BINDER BREAST LRG (GAUZE/BANDAGES/DRESSINGS) IMPLANT
BINDER BREAST XLRG (GAUZE/BANDAGES/DRESSINGS) IMPLANT
CANISTER SUCT 3000ML PPV (MISCELLANEOUS) IMPLANT
CHLORAPREP W/TINT 26 (MISCELLANEOUS) ×1 IMPLANT
CLIP APPLIE 9.375 MED OPEN (MISCELLANEOUS) IMPLANT
COVER PROBE W GEL 5X96 (DRAPES) ×1 IMPLANT
COVER SURGICAL LIGHT HANDLE (MISCELLANEOUS) ×1 IMPLANT
DERMABOND ADVANCED .7 DNX12 (GAUZE/BANDAGES/DRESSINGS) ×1 IMPLANT
DEVICE DUBIN SPECIMEN MAMMOGRA (MISCELLANEOUS) ×1 IMPLANT
DRAPE CHEST BREAST 15X10 FENES (DRAPES) ×1 IMPLANT
ELECT CAUTERY BLADE 6.4 (BLADE) ×1 IMPLANT
ELECT REM PT RETURN 9FT ADLT (ELECTROSURGICAL) ×1
ELECTRODE REM PT RTRN 9FT ADLT (ELECTROSURGICAL) ×1 IMPLANT
GAUZE PAD ABD 8X10 STRL (GAUZE/BANDAGES/DRESSINGS) ×1 IMPLANT
GLOVE BIO SURGEON STRL SZ8 (GLOVE) ×1 IMPLANT
GLOVE BIOGEL PI IND STRL 8 (GLOVE) ×1 IMPLANT
GOWN STRL REUS W/ TWL LRG LVL3 (GOWN DISPOSABLE) ×1 IMPLANT
GOWN STRL REUS W/ TWL XL LVL3 (GOWN DISPOSABLE) ×1 IMPLANT
GOWN STRL REUS W/TWL LRG LVL3 (GOWN DISPOSABLE) ×1
GOWN STRL REUS W/TWL XL LVL3 (GOWN DISPOSABLE) ×1
KIT BASIN OR (CUSTOM PROCEDURE TRAY) ×1 IMPLANT
KIT MARKER MARGIN INK (KITS) ×1 IMPLANT
LIGHT WAVEGUIDE WIDE FLAT (MISCELLANEOUS) IMPLANT
NDL HYPO 25GX1X1/2 BEV (NEEDLE) ×1 IMPLANT
NEEDLE HYPO 25GX1X1/2 BEV (NEEDLE) ×1
NS IRRIG 1000ML POUR BTL (IV SOLUTION) IMPLANT
PACK GENERAL/GYN (CUSTOM PROCEDURE TRAY) ×1 IMPLANT
SUT MNCRL AB 4-0 PS2 18 (SUTURE) ×1 IMPLANT
SUT SILK 2 0 SH (SUTURE) IMPLANT
SUT VIC AB 2-0 SH 27 (SUTURE)
SUT VIC AB 2-0 SH 27XBRD (SUTURE) IMPLANT
SUT VIC AB 3-0 SH 8-18 (SUTURE) ×1 IMPLANT
SYR CONTROL 10ML LL (SYRINGE) ×1 IMPLANT
TOWEL GREEN STERILE FF (TOWEL DISPOSABLE) IMPLANT

## 2023-07-04 NOTE — Anesthesia Procedure Notes (Signed)
Procedure Name: LMA Insertion Date/Time: 07/04/2023 2:00 PM  Performed by: Earlene Plater, CRNAPre-anesthesia Checklist: Patient identified and Emergency Drugs available Patient Re-evaluated:Patient Re-evaluated prior to induction Oxygen Delivery Method: Circle System Utilized Preoxygenation: Pre-oxygenation with 100% oxygen Induction Type: IV induction Ventilation: Mask ventilation without difficulty LMA: LMA inserted LMA Size: 4.0 Number of attempts: 1 Airway Equipment and Method: Bite block Placement Confirmation: positive ETCO2, CO2 detector and breath sounds checked- equal and bilateral Tube secured with: Tape Dental Injury: Teeth and Oropharynx as per pre-operative assessment

## 2023-07-04 NOTE — Interval H&P Note (Signed)
History and Physical Interval Note:  07/04/2023 1:15 PM  Glenda Hill  has presented today for surgery, with the diagnosis of LEFT BREAST DUCTAL CARCINOMA IN SITU.  The various methods of treatment have been discussed with the patient and family. After consideration of risks, benefits and other options for treatment, the patient has consented to  Procedure(s): LEFT BREAST LUMPECTOMY WITH RADIOACTIVE SEED LOCALIZATION (Left) as a surgical intervention.  The patient's history has been reviewed, patient examined, no change in status, stable for surgery.  I have reviewed the patient's chart and labs.  Questions were answered to the patient's satisfaction.   The procedure has been discussed with the patient. Alternatives to surgery have been discussed with the patient.  Risks of surgery include bleeding,  Infection,  Seroma formation, death,  and the need for further surgery.   The patient understands and wishes to proceed.   Dortha Schwalbe MD

## 2023-07-04 NOTE — Transfer of Care (Signed)
Immediate Anesthesia Transfer of Care Note  Patient: Glenda Hill  Procedure(s) Performed: LEFT BREAST LUMPECTOMY WITH RADIOACTIVE SEED LOCALIZATION (Left)  Patient Location: PACU  Anesthesia Type:General  Level of Consciousness: awake, alert , oriented, patient cooperative, and responds to stimulation  Airway & Oxygen Therapy: Patient Spontanous Breathing and Patient connected to face mask oxygen  Post-op Assessment: Report given to RN, Post -op Vital signs reviewed and stable, and Patient moving all extremities  Post vital signs: Reviewed and stable  Last Vitals:  Vitals Value Taken Time  BP 111/92 07/04/23 1447  Temp 36.5 C 07/04/23 1447  Pulse 61 07/04/23 1453  Resp 14 07/04/23 1453  SpO2 92 % 07/04/23 1453  Vitals shown include unfiled device data.  Last Pain:  Vitals:   07/04/23 1447  TempSrc:   PainSc: 0-No pain         Complications: No notable events documented.  *Report given to PACU RN for continued care and followup.

## 2023-07-04 NOTE — Discharge Instructions (Signed)

## 2023-07-04 NOTE — Op Note (Signed)
Preoperative diagnosis: Stage I left breast cancer upper outer quadrant ER positive  Postoperative diagnosis: Same  Procedure: Left breast seed localized lumpectomy  Surgeon: Harriette Bouillon, MD  Anesthesia: LMA with 0.25% Marcaine with epinephrine  EBL: Minimal  Specimen: Left breast tissue with seed and clip verified by Faxitron  Drains: None  Indications for procedure: The patient is a 67 year old female seen in the [facility] for left breast cancer.  She reviewed all her options and preferred breast conserving surgery.  We discussed that lymph node mapping as well and she opted out of that after discussion the pros and cons and potential complications of lymphedema and shoulder stiffness and pain.  This was reviewed with the team.  She proceeds today with left breast seed localized lumpectomy for stage I left breast cancer upper outer quadrant.The procedure has been discussed with the patient. Alternatives to surgery have been discussed with the patient.  Risks of surgery include bleeding,  Infection,  Seroma formation, death,  and the need for further surgery.   The patient understands and wishes to proceed.          Description of procedure: The patient was met in the holding area and questions were answered.  Of note his seed was placed as an outpatient by radiology.  The left breast was marked as the correct site.  Films were available for review.  She was taken back to the operating.  She was placed supine upon the operating table.  After induction of general anesthesia, left breast was prepped and draped in sterile fashion and timeout performed.  Neoprobe used to identify the seed left breast upper outer quadrant.  A curvilinear incision was made over the signal.  Dissection was carried down all tissue and the seed and clip were excised with grossly negative margins.  Of note the anterior margin and skin.  Imaging revealed the seed and clip to be in the specimen.  Irrigation was used.   Hemostasis achieved with cautery.  Clips were placed to mark the cavity.  Local anesthetic was infiltrated throughout the cavity.  Deep tissue planes were approximated with 3-0 Vicryl.  4 Monocryl was used to close the skin in a subcuticular fashion.  Dermabond applied.  All counts found to be correct.  Breast binder placed.  The patient was awoke extubated taken to recovery in satisfactory condition.

## 2023-07-04 NOTE — Anesthesia Preprocedure Evaluation (Signed)
Anesthesia Evaluation  Patient identified by MRN, date of birth, ID band Patient awake    Reviewed: Allergy & Precautions, NPO status , Patient's Chart, lab work & pertinent test results  History of Anesthesia Complications (+) PROLONGED EMERGENCE and history of anesthetic complications  Airway Mallampati: II  TM Distance: >3 FB Neck ROM: Full    Dental  (+) Missing, Chipped, Dental Advisory Given,    Pulmonary Current Smoker and Patient abstained from smoking.   breath sounds clear to auscultation       Cardiovascular hypertension, Pt. on medications  Rhythm:Regular Rate:Normal     Neuro/Psych    GI/Hepatic Neg liver ROS,GERD  ,,  Endo/Other  diabetes, Type 2    Renal/GU negative Renal ROS     Musculoskeletal   Abdominal   Peds  Hematology negative hematology ROS (+)   Anesthesia Other Findings   Reproductive/Obstetrics                             Anesthesia Physical Anesthesia Plan  ASA: 3  Anesthesia Plan: General   Post-op Pain Management: Tylenol PO (pre-op)*   Induction: Intravenous  PONV Risk Score and Plan: 2 and Ondansetron, Midazolam and Treatment may vary due to age or medical condition  Airway Management Planned: LMA  Additional Equipment:   Intra-op Plan:   Post-operative Plan: Extubation in OR  Informed Consent: I have reviewed the patients History and Physical, chart, labs and discussed the procedure including the risks, benefits and alternatives for the proposed anesthesia with the patient or authorized representative who has indicated his/her understanding and acceptance.       Plan Discussed with: CRNA  Anesthesia Plan Comments:         Anesthesia Quick Evaluation

## 2023-07-04 NOTE — Anesthesia Postprocedure Evaluation (Signed)
Anesthesia Post Note  Patient: Glenda Hill  Procedure(s) Performed: LEFT BREAST LUMPECTOMY WITH RADIOACTIVE SEED LOCALIZATION (Left)     Patient location during evaluation: PACU Anesthesia Type: General Level of consciousness: awake and alert Pain management: pain level controlled Vital Signs Assessment: post-procedure vital signs reviewed and stable Respiratory status: spontaneous breathing, nonlabored ventilation and respiratory function stable Cardiovascular status: blood pressure returned to baseline and stable Postop Assessment: no apparent nausea or vomiting Anesthetic complications: no   No notable events documented.  Last Vitals:  Vitals:   07/04/23 1515 07/04/23 1530  BP: 136/71 (!) 145/78  Pulse: (!) 55 (!) 57  Resp: 15 14  Temp:    SpO2: 94% 90%    Last Pain:  Vitals:   07/04/23 1447  TempSrc:   PainSc: 0-No pain   Pain Goal:                   Lowella Curb

## 2023-07-05 ENCOUNTER — Encounter (HOSPITAL_COMMUNITY): Payer: Self-pay | Admitting: Surgery

## 2023-07-08 LAB — SURGICAL PATHOLOGY

## 2023-07-09 ENCOUNTER — Encounter: Payer: Self-pay | Admitting: *Deleted

## 2023-07-09 ENCOUNTER — Encounter: Payer: Self-pay | Admitting: Surgery

## 2023-07-12 NOTE — Progress Notes (Signed)
HEMATOLOGY-ONCOLOGY TELEPHONE VISIT PROGRESS NOTE  I connected with our patient on 07/15/23 at 11:00 AM EDT by telephone and verified that I am speaking with the correct person using two identifiers.  I discussed the limitations, risks, security and privacy concerns of performing an evaluation and management service by telephone and the availability of in person appointments.  I also discussed with the patient that there may be a patient responsible charge related to this service. The patient expressed understanding and agreed to proceed.   History of Present Illness: Glenda Hill 67 y.o. female is here because of recent diagnosis of left breast DCIS. She presents to the clinic for a telephone follow-up after surgery to discuss final pathology report and at that time she will make a final decision on where she wants radiation.  She is healing and recovering very well from surgery.    Oncology History  Ductal carcinoma in situ (DCIS) of left breast  06/03/2023 Initial Diagnosis   Screening mammogram detected left breast distortion 1 cm, biopsy revealed intermediate grade DCIS ER 100%, PR 60%   07/03/2023 Cancer Staging   Staging form: Breast, AJCC 8th Edition - Clinical: Stage 0 (cTis (DCIS), cN0, cM0, G2, ER+, PR+, HER2: Not Assessed) - Signed by Serena Croissant, MD on 07/03/2023 Stage prefix: Initial diagnosis Histologic grading system: 3 grade system   07/04/2023 Surgery   Left lumpectomy: Scattered foci of DCIS intermediate grade discontinuously involving a fibrotic area of about 1 cm, margins negative, ER 100%, PR 60%     REVIEW OF SYSTEMS:   Constitutional: Denies fevers, chills or abnormal weight loss All other systems were reviewed with the patient and are negative. Observations/Objective:     Assessment Plan:  Ductal carcinoma in situ (DCIS) of left breast 07/04/2023: Left lumpectomy: Scattered foci of DCIS intermediate grade discontinuously involving a fibrotic area of about  1 cm, margins negative, ER 100%, PR 60%  Pathology counseling: I discussed the final pathology report of the patient provided  a copy of this report. I discussed the margins.  We also discussed the final staging along with previously performed ER/PR testing.  Treatment plan: adjuvant radiation therapy: She decided to do radiation at [facility].  She has an appointment to see Dr. Mitzi Hansen.  Because she is from bit of a distance she is hoping that she would have fewer radiation treatments. 2. Followed by antiestrogen therapy with tamoxifen 5 years  Return to clinic after radiation is completed    I discussed the assessment and treatment plan with the patient. The patient was provided an opportunity to ask questions and all were answered. The patient agreed with the plan and demonstrated an understanding of the instructions. The patient was advised to call back or seek an in-person evaluation if the symptoms worsen or if the condition fails to improve as anticipated.   I provided 12 minutes of non-face-to-face time during this encounter.  This includes time for charting and coordination of care   Tamsen Meek, MD  I Janan Ridge am acting as a scribe for Dr.Vinay Gudena  I have reviewed the above documentation for accuracy and completeness, and I agree with the above.

## 2023-07-15 ENCOUNTER — Encounter: Payer: Self-pay | Admitting: *Deleted

## 2023-07-15 ENCOUNTER — Inpatient Hospital Stay (HOSPITAL_BASED_OUTPATIENT_CLINIC_OR_DEPARTMENT_OTHER): Payer: Medicare Other | Admitting: Hematology and Oncology

## 2023-07-15 DIAGNOSIS — D0512 Intraductal carcinoma in situ of left breast: Secondary | ICD-10-CM

## 2023-07-15 NOTE — Assessment & Plan Note (Signed)
07/04/2023: Left lumpectomy: Scattered foci of DCIS intermediate grade discontinuously involving a fibrotic area of about 1 cm, margins negative, ER 100%, PR 60%  Pathology counseling: I discussed the final pathology report of the patient provided  a copy of this report. I discussed the margins.  We also discussed the final staging along with previously performed ER/PR testing.  Treatment plan: adjuvant radiation therapy (debating whether to do radiation at [facility] or at [facility].  I discussed with him about breath-hold technique) their concern is the number of trips that they have to make. 2. Followed by antiestrogen therapy with tamoxifen 5 years  Return to clinic after radiation is completed

## 2023-07-16 ENCOUNTER — Telehealth: Payer: Self-pay | Admitting: Genetic Counselor

## 2023-07-16 ENCOUNTER — Ambulatory Visit: Payer: Self-pay | Admitting: Genetic Counselor

## 2023-07-16 ENCOUNTER — Encounter: Payer: Self-pay | Admitting: Genetic Counselor

## 2023-07-16 DIAGNOSIS — Z8041 Family history of malignant neoplasm of ovary: Secondary | ICD-10-CM

## 2023-07-16 DIAGNOSIS — Z1379 Encounter for other screening for genetic and chromosomal anomalies: Secondary | ICD-10-CM | POA: Insufficient documentation

## 2023-07-16 DIAGNOSIS — D0512 Intraductal carcinoma in situ of left breast: Secondary | ICD-10-CM

## 2023-07-16 DIAGNOSIS — Z01810 Encounter for preprocedural cardiovascular examination: Secondary | ICD-10-CM | POA: Insufficient documentation

## 2023-07-16 NOTE — Progress Notes (Signed)
HPI:   Ms. Hulslander was previously seen in the [facility] due to a personal history of breast cancer, a family history of ovarian cancer, and concerns regarding a hereditary predisposition to cancer.    Ms. Mctyre recent genetic test results were disclosed to her by telephone. These results and recommendations are discussed in more detail below.  CANCER HISTORY:  Oncology History  Ductal carcinoma in situ (DCIS) of left breast  06/03/2023 Initial Diagnosis   Screening mammogram detected left breast distortion 1 cm, biopsy revealed intermediate grade DCIS ER 100%, PR 60%   07/03/2023 Cancer Staging   Staging form: Breast, AJCC 8th Edition - Clinical: Stage 0 (cTis (DCIS), cN0, cM0, G2, ER+, PR+, HER2: Not Assessed) - Signed by Serena Croissant, MD on 07/03/2023 Stage prefix: Initial diagnosis Histologic grading system: 3 grade system   07/04/2023 Surgery   Left lumpectomy: Scattered foci of DCIS intermediate grade discontinuously involving a fibrotic area of about 1 cm, margins negative, ER 100%, PR 60%   07/16/2023 Genetic Testing   Negative [facility] Common Hereditary Cancers +RNA Panel.  Report date is 07/16/2023.    The [facility] Common Hereditary Cancers + RNA Panel includes sequencing, deletion/duplication, and RNA analysis of the following 48 genes: APC, ATM, AXIN2, BAP1, BARD1, BMPR1A, BRCA1, BRCA2, BRIP1, CDH1, CDK4*, CDKN2A*, CHEK2, CTNNA1, DICER1, EPCAM* (del/dup only), FH, GREM1* (promoter dup analysis only), HOXB13*, KIT*, MBD4*, MEN1, MLH1, MSH2, MSH3, MSH6, MUTYH, NF1, NTHL1, PALB2, PDGFRA*, PMS2, POLD1, POLE, PTEN, RAD51C, RAD51D, SDHA (sequencing only), SDHB, SDHC, SDHD, SMAD4, SMARCA4, STK11, TP53, TSC1, TSC2, VHL.  *Genes without RNA analysis.      FAMILY HISTORY:  We obtained a detailed, 4-generation family history.  Significant diagnoses are listed below:      Family History  Problem Relation Age of Onset   Lung cancer Mother 66        smoking hx    Emphysema Mother          smoking hx   Ovarian cancer Sister          dx 12-50   Cervical cancer Paternal Grandmother          dx unknown age   Cancer Cousin 85        stomach and lung; pat female cousin     Ms. Robben is unaware of previous family history of genetic testing for hereditary cancer risks. There is no reported Ashkenazi Jewish ancestry. There is no known consanguinity.    GENETIC TEST RESULTS:  The [facility] Common Hereditary Cancers +RNA Panel found no pathogenic mutations.    The [facility] Common Hereditary Cancers + RNA Panel includes sequencing, deletion/duplication, and RNA analysis of the following 48 genes: APC, ATM, AXIN2, BAP1, BARD1, BMPR1A, BRCA1, BRCA2, BRIP1, CDH1, CDK4*, CDKN2A*, CHEK2, CTNNA1, DICER1, EPCAM* (del/dup only), FH, GREM1* (promoter dup analysis only), HOXB13*, KIT*, MBD4*, MEN1, MLH1, MSH2, MSH3, MSH6, MUTYH, NF1, NTHL1, PALB2, PDGFRA*, PMS2, POLD1, POLE, PTEN, RAD51C, RAD51D, SDHA (sequencing only), SDHB, SDHC, SDHD, SMAD4, SMARCA4, STK11, TP53, TSC1, TSC2, VHL.  *Genes without RNA analysis.  .   The test report has been scanned into EPIC and is located under the Molecular Pathology section of the Results Review tab.  A portion of the result report is included below for reference. Genetic testing reported out on July 16, 2023.       Even though a pathogenic variant was not identified, possible explanations for the cancer in the family may include: There may be no hereditary risk  for cancer in the family. The cancers in Ms. Dudzinski and/or her family may be sporadic/familial or due to other genetic and environmental factors. There may be a gene mutation in one of these genes that current testing methods cannot detect but that chance is small. There could be another gene that has not yet been discovered, or that we have not yet tested, that is responsible for the cancer diagnoses in the family.  It is also possible there is a hereditary cause for the  cancer in the family that Ms. Madl did not inherit.   Therefore, it is important to remain in touch with cancer genetics in the future so that we can continue to offer Ms. Egolf the most up to date genetic testing.    ADDITIONAL GENETIC TESTING:   Ms. Leath genetic testing was fairly extensive.  If there are additional relevant genes identified to increase cancer risk that can be analyzed in the future, we would be happy to discuss and coordinate this testing at that time.     CANCER SCREENING RECOMMENDATIONS:  Ms. Armenta test result is considered negative (normal).  This means that we have not identified a hereditary cause for her personal history of DCIS at this time.   An individual's cancer risk and medical management are not determined by genetic test results alone. Overall cancer risk assessment incorporates additional factors, including personal medical history, family history, and any available genetic information that may result in a personalized plan for cancer prevention and surveillance. Therefore, it is recommended she continue to follow the cancer management and screening guidelines provided by her oncology and primary healthcare provider.    RECOMMENDATIONS FOR FAMILY MEMBERS:   Since she did not inherit a identifiable mutation in a cancer predisposition gene included on this panel, her daughter could not have inherited a known mutation from her in one of these genes. Individuals in this family might be at some increased risk of developing cancer, over the general population risk, due to the family history of cancer.  Individuals in the family should notify their providers of the family history of cancer. We recommend women in this family have a yearly mammogram beginning at age 74, or 70 years younger than the earliest onset of cancer, an annual clinical breast exam, and perform monthly breast self-exams.  Risk models that take into account family history and  hormonal history may be helpful in determining appropriate breast cancer screening options for family members. Other members of the family may still carry a pathogenic variant in one of these genes that Ms. Byington did not inherit. Based on the family history, we recommend her sister, who was diagnosed with ovarian cancer, have genetic counseling and testing. Ms. Makarewicz said she is not in contact with this sister at this time.    FOLLOW-UP:  Cancer genetics is a rapidly advancing field and it is possible that new genetic tests will be appropriate for her and/or her family members in the future. We encourage Ms. Eichinger to remain in contact with cancer genetics, so we can update her personal and family histories and let her know of advances in cancer genetics that may benefit this family.   Our contact number was provided.  They are welcome to call us at anytime with additional questions or concerns.   [place] Latulippe M. Rennie Plowman, MS, [facility] Genetic Counselor [email or web address] (P) [phone]

## 2023-07-16 NOTE — Telephone Encounter (Signed)
Disclosed negative genetics.    

## 2023-08-01 ENCOUNTER — Ambulatory Visit
Admission: RE | Admit: 2023-08-01 | Discharge: 2023-08-01 | Disposition: A | Discharge status: Home / Self Care | Payer: Medicare Other | Source: Ambulatory Visit | Attending: Radiation Oncology | Admitting: Radiation Oncology

## 2023-08-01 ENCOUNTER — Encounter: Payer: Self-pay | Admitting: Radiation Oncology

## 2023-08-01 VITALS — BP 127/72 | HR 75 | Temp 97.5°F | Resp 18 | Ht 69.0 in | Wt 179.0 lb

## 2023-08-01 DIAGNOSIS — Z800 Family history of malignant neoplasm of digestive organs: Secondary | ICD-10-CM | POA: Insufficient documentation

## 2023-08-01 DIAGNOSIS — Z8041 Family history of malignant neoplasm of ovary: Secondary | ICD-10-CM | POA: Diagnosis not present

## 2023-08-01 DIAGNOSIS — R053 Chronic cough: Secondary | ICD-10-CM | POA: Insufficient documentation

## 2023-08-01 DIAGNOSIS — D0512 Intraductal carcinoma in situ of left breast: Secondary | ICD-10-CM

## 2023-08-01 DIAGNOSIS — K219 Gastro-esophageal reflux disease without esophagitis: Secondary | ICD-10-CM | POA: Diagnosis not present

## 2023-08-01 DIAGNOSIS — Z87891 Personal history of nicotine dependence: Secondary | ICD-10-CM | POA: Insufficient documentation

## 2023-08-01 DIAGNOSIS — Z510 Encounter for antineoplastic radiation therapy: Secondary | ICD-10-CM | POA: Diagnosis not present

## 2023-08-01 DIAGNOSIS — Z801 Family history of malignant neoplasm of trachea, bronchus and lung: Secondary | ICD-10-CM | POA: Diagnosis not present

## 2023-08-01 DIAGNOSIS — Z79899 Other long term (current) drug therapy: Secondary | ICD-10-CM | POA: Diagnosis not present

## 2023-08-01 DIAGNOSIS — I10 Essential (primary) hypertension: Secondary | ICD-10-CM | POA: Insufficient documentation

## 2023-08-01 DIAGNOSIS — Z170 Estrogen receptor positive status [ER+]: Secondary | ICD-10-CM | POA: Diagnosis not present

## 2023-08-01 NOTE — Progress Notes (Signed)
Radiation Oncology         [phone] ________________________________   Name: Glenda Hill        MRN: [id number]  Date of Service: 08/01/2023 DOB: 09-18-1956  WU:[id number], Angelina Pih, MD  Serena Croissant, MD     REFERRING PHYSICIAN: Serena Croissant, MD   DIAGNOSIS: The encounter diagnosis was Ductal carcinoma in situ (DCIS) of left breast.   HISTORY OF PRESENT ILLNESS: Glenda Hill is a 67 y.o. female with a diagnosis of left breast cancer. The patient was noted to have possible asymmetries on her screening mammogram in June 2024 at [facility] in [place].  Further diagnostic workup showed multiple vague areas of shadowing in the upper outer quadrant of the left breast though no reproducible sonographic correlate could be seen.  She underwent a stereotactic biopsy in  at the breast center on 06/03/2023 which showed intermediate grade DCIS that was ER/PR positive.    She underwent a left lumpectomy on 07/04/23, which showed scattered foci of intermediate grade DCIS discontinuously in a fibrotic area of 1 cm, and fibrocystic change. The margins were all clear, the closest being 1.1 cm. She's seen today to discuss adjuvant radiotherapy.   PREVIOUS RADIATION THERAPY: No   PAST MEDICAL HISTORY:  Past Medical History:  Diagnosis Date   Complication of anesthesia    trouble waking up after C section   Ductal carcinoma in situ (DCIS) of left breast 2024   GERD (gastroesophageal reflux disease)    Hypertension        PAST SURGICAL HISTORY: Past Surgical History:  Procedure Laterality Date   BACK SURGERY     x2 2014 and 2018   BREAST BIOPSY Left 06/03/2023   MM LT BREAST BX W LOC DEV 1ST LESION IMAGE BX SPEC STEREO GUIDE 06/03/2023 [facility]   BREAST BIOPSY  07/02/2023   MM LT RADIOACTIVE SEED LOC MAMMO GUIDE 07/02/2023 [facility]   BREAST LUMPECTOMY WITH RADIOACTIVE SEED LOCALIZATION Left 07/04/2023   Procedure: LEFT BREAST LUMPECTOMY WITH RADIOACTIVE  SEED LOCALIZATION;  Surgeon: Harriette Bouillon, MD;  Location: [facility] OR;  Service: General;  Laterality: Left;   CESAREAN SECTION     TONSILLECTOMY     removed as a child     FAMILY HISTORY:  Family History  Problem Relation Age of Onset   Heart disease Mother    Lung cancer Mother 43       smoking hx   Emphysema Mother        smoking hx   Ovarian cancer Sister        dx 73-50   Cervical cancer Paternal Grandmother        dx unknown age   Arthritis Other    Lung disease Other    Diabetes Other    Cancer Cousin 60       stomach and lung; pat female cousin     SOCIAL HISTORY:  reports that she has been smoking cigarettes. She has a 4 pack-year smoking history. She has never used smokeless tobacco. She reports that she does not currently use alcohol. She reports that she does not use drugs.  The patient is divorced and lives in [place].    ALLERGIES: Patient has no known allergies.   MEDICATIONS:  Current Outpatient Medications  Medication Sig Dispense Refill   amLODipine (NORVASC) 10 MG tablet Take 10 mg by mouth daily.     Calcium Carb-Cholecalciferol 600-20 MG-MCG TABS Take 600 mg by mouth 2 (two) times daily.  candesartan-hydrochlorothiazide (ATACAND HCT) 32-12.5 MG tablet Take 1 tablet by mouth daily.     carvedilol (COREG) 12.5 MG tablet Take 12.5 mg by mouth 2 (two) times daily.     ibuprofen (ADVIL,MOTRIN) 200 MG tablet Take 400 mg by mouth every 6 (six) hours as needed for headache (pain).     Multiple Vitamin (MULTIVITAMIN WITH MINERALS) TABS tablet Take 1 tablet by mouth daily. Woman     oxyCODONE (OXY IR/ROXICODONE) 5 MG immediate release tablet Take 1 tablet (5 mg total) by mouth every 6 (six) hours as needed for severe pain. 15 tablet 0   pantoprazole (PROTONIX) 40 MG tablet Take 40 mg by mouth daily.     TRELEGY ELLIPTA 100-62.5-25 MCG/ACT AEPB Inhale 1 puff into the lungs daily.     Vitamin D, Ergocalciferol, (DRISDOL) 1.25 MG (50000 UNIT) CAPS capsule Take 50,000  Units by mouth once a week.     No current facility-administered medications for this encounter.     REVIEW OF SYSTEMS: On review of systems, the patient reports that she is doing okay but that for about a month she's developed a chronic dry and sometimes productive cough. She has more than 15 pack years of cigarette smoking and has never had lung cancer screening. She reports she recently changed a blood pressure medication and thought that may be the reason for the cough.      PHYSICAL EXAM:  Wt Readings from Last 3 Encounters:  07/04/23 181 lb (82.1 kg)  07/03/23 181 lb 4.8 oz (82.2 kg)  06/27/23 171 lb 3.2 oz (77.7 kg)   Temp Readings from Last 3 Encounters:  07/04/23 97.7 F (36.5 C)  07/03/23 97.8 F (36.6 C) (Temporal)  06/27/23 98.2 F (36.8 C)   BP Readings from Last 3 Encounters:  07/04/23 (!) 145/78  07/03/23 (!) 143/75  06/27/23 (!) 156/78   Pulse Readings from Last 3 Encounters:  07/04/23 (!) 57  07/03/23 65  06/27/23 60   In general this is a well appearing caucasian female in no acute distress. She's alert and oriented x4 and appropriate throughout the examination. Cardiopulmonary assessment is negative for acute distress and she exhibits normal effort. Her left breast has a healing incision with minimal erythema at the edges of her incision medially and laterally consistent with what appears to be resolving suture granuloma. No cellulitic changes, separation or drainage is otherwise noted.      ECOG = 0  0 - Asymptomatic (Fully active, able to carry on all predisease activities without restriction)  1 - Symptomatic but completely ambulatory (Restricted in physically strenuous activity but ambulatory and able to carry out work of a light or sedentary nature. For example, light housework, office work)  2 - Symptomatic, <50% in bed during the day (Ambulatory and capable of all self care but unable to carry out any work activities. Up and about more than 50%  of waking hours)  3 - Symptomatic, >50% in bed, but not bedbound (Capable of only limited self-care, confined to bed or chair 50% or more of waking hours)  4 - Bedbound (Completely disabled. Cannot carry on any self-care. Totally confined to bed or chair)  5 - Death   [name] MM, Creech RH, Tormey DC, et al. [phone]). "Toxicity and response criteria of the [facility]". Am. Evlyn Clines. Oncol. 5 (6): 649-55    LABORATORY DATA:  Lab Results  Component Value Date   WBC 9.0 06/27/2023   HGB 14.3 06/27/2023  HCT 42.5 06/27/2023   MCV 94.2 06/27/2023   PLT 272 06/27/2023   Lab Results  Component Value Date   NA 137 06/27/2023   K 3.5 06/27/2023   CL 97 (L) 06/27/2023   CO2 29 06/27/2023   Lab Results  Component Value Date   ALT 11 12/28/2014   AST 16 12/28/2014   ALKPHOS 68 12/28/2014   BILITOT 0.4 12/28/2014      RADIOGRAPHY: MM Breast Surgical Specimen  Result Date: 07/04/2023 CLINICAL DATA:  Evaluate surgical specimen following lumpectomy for LEFT breast cancer. EXAM: SPECIMEN RADIOGRAPH OF THE LEFT BREAST COMPARISON:  Previous exam(s). FINDINGS: Status post excision of the LEFT breast. The radioactive seed and X biopsy marker clip are present and intact. IMPRESSION: Specimen radiograph of the LEFT breast. Electronically Signed   By: Harmon Pier M.D.   On: 07/04/2023 14:31       IMPRESSION/PLAN: 1. Intermediate grade ER/PR positive DCIS of the left breast.  Dr. Mitzi Hansen has reviewed her final pathology findings and today we reviewed  left  breast disease. She has done well since lumpectomy.  Dr. Mitzi Hansen recommends external radiotherapy to the breast  to reduce risks of local recurrence followed by antiestrogen therapy. We discussed the risks, benefits, short, and long term effects of radiotherapy, as well as the curative intent, and the patient is interested in proceeding. We reviewed the delivery and logistics of radiotherapy and Dr. Mitzi Hansen recommends 4 weeks of  radiotherapy to the left breast with deep inspiration breath-hold technique. Written consent is obtained and placed in the chart, a copy was provided to the patient. She will simulate today. 2. Persistent cough with risk factors for lung cancer. We discussed a diagnostic CT for lung cancer screening. We will refer her to pulmonary medicine if she does not have any findings on this scan so she can be followed in their lung cancer surveillance program.      In a visit lasting 45 minutes, greater than 50% of the time was spent face to face reviewing her case, as well as in preparation of, discussing, and coordinating the patient's care.      Osker Mason, [facility]    **Disclaimer: This note was dictated with voice recognition software. Similar sounding words can inadvertently be transcribed and this note may contain transcription errors which may not have been corrected upon publication of note.**

## 2023-08-01 NOTE — Progress Notes (Signed)
Nursing interview for Ductal carcinoma in situ (DCIS) of left breast. Patient identity verified x2.  Patient reports LT breast skin irritation (redness) around the nipple and incision line w/ some mild swelling. Patient was  placed on a preventative ABX (does not know the name). No other issues conveyed at this time.  Meaningful use complete. Postmenopausal   Vitals- BP 127/72 (BP Location: Left Arm, Patient Position: Sitting, Cuff Size: Normal)   Pulse 75   Temp (!) 97.5 F (36.4 C) (Temporal)   Resp 18   Ht 5\' 9"  (1.753 m)   Wt 179 lb (81.2 kg)   SpO2 97%   BMI 26.43 kg/m   This concludes the interaction.  Ruel Favors, LPN

## 2023-08-06 ENCOUNTER — Telehealth: Payer: Self-pay | Admitting: *Deleted

## 2023-08-06 DIAGNOSIS — D0512 Intraductal carcinoma in situ of left breast: Secondary | ICD-10-CM | POA: Diagnosis not present

## 2023-08-06 NOTE — Telephone Encounter (Signed)
Called patient to inform of CT for 08-07-23- arrival time- 2:45 pm @ [facility], no restrictions to test, lvm for a return call

## 2023-08-07 ENCOUNTER — Ambulatory Visit (HOSPITAL_COMMUNITY)
Admission: RE | Admit: 2023-08-07 | Discharge: 2023-08-07 | Disposition: A | Discharge status: Home / Self Care | Payer: Medicare Other | Source: Ambulatory Visit | Attending: Radiation Oncology | Admitting: Radiation Oncology

## 2023-08-07 ENCOUNTER — Encounter: Payer: Self-pay | Admitting: *Deleted

## 2023-08-07 DIAGNOSIS — D0512 Intraductal carcinoma in situ of left breast: Secondary | ICD-10-CM | POA: Diagnosis present

## 2023-08-15 ENCOUNTER — Telehealth: Payer: Self-pay | Admitting: Radiation Oncology

## 2023-08-15 NOTE — Telephone Encounter (Signed)
I left a voicemail for the patient regarding CT results and the recommendation for her to still be followed for lung cancer screening. I let her know that I had reached out to the NP who runs the lung cancer screening clinic at [facility] as well. Otherwise she can proceed with her radiation beginning on Monday for her early stage breast cancer.

## 2023-08-19 ENCOUNTER — Other Ambulatory Visit: Payer: Self-pay

## 2023-08-19 ENCOUNTER — Ambulatory Visit
Admission: RE | Admit: 2023-08-19 | Discharge: 2023-08-19 | Disposition: A | Discharge status: Home / Self Care | Payer: Medicare Other | Source: Ambulatory Visit | Attending: Radiation Oncology | Admitting: Radiation Oncology

## 2023-08-19 DIAGNOSIS — D0512 Intraductal carcinoma in situ of left breast: Secondary | ICD-10-CM | POA: Diagnosis not present

## 2023-08-19 LAB — RAD ONC ARIA SESSION SUMMARY
Course Elapsed Days: 0
Plan Fractions Treated to Date: 1
Plan Prescribed Dose Per Fraction: 2.66 Gy
Plan Total Fractions Prescribed: 16
Plan Total Prescribed Dose: 42.56 Gy
Reference Point Dosage Given to Date: 2.66 Gy
Reference Point Session Dosage Given: 2.66 Gy
Session Number: 1

## 2023-08-20 ENCOUNTER — Other Ambulatory Visit: Payer: Self-pay

## 2023-08-20 ENCOUNTER — Ambulatory Visit
Admission: RE | Admit: 2023-08-20 | Discharge: 2023-08-20 | Disposition: A | Discharge status: Home / Self Care | Payer: Medicare Other | Source: Ambulatory Visit | Attending: Radiation Oncology | Admitting: Radiation Oncology

## 2023-08-20 DIAGNOSIS — D0512 Intraductal carcinoma in situ of left breast: Secondary | ICD-10-CM | POA: Insufficient documentation

## 2023-08-20 LAB — RAD ONC ARIA SESSION SUMMARY
Course Elapsed Days: 1
Plan Fractions Treated to Date: 2
Plan Prescribed Dose Per Fraction: 2.66 Gy
Plan Total Fractions Prescribed: 16
Plan Total Prescribed Dose: 42.56 Gy
Reference Point Dosage Given to Date: 5.32 Gy
Reference Point Session Dosage Given: 2.66 Gy
Session Number: 2

## 2023-08-21 ENCOUNTER — Ambulatory Visit
Admission: RE | Admit: 2023-08-21 | Discharge: 2023-08-21 | Disposition: A | Discharge status: Home / Self Care | Payer: Medicare Other | Source: Ambulatory Visit | Attending: Radiation Oncology | Admitting: Radiation Oncology

## 2023-08-21 ENCOUNTER — Other Ambulatory Visit: Payer: Self-pay

## 2023-08-21 DIAGNOSIS — D0512 Intraductal carcinoma in situ of left breast: Secondary | ICD-10-CM | POA: Diagnosis not present

## 2023-08-21 LAB — RAD ONC ARIA SESSION SUMMARY
Course Elapsed Days: 2
Plan Fractions Treated to Date: 3
Plan Prescribed Dose Per Fraction: 2.66 Gy
Plan Total Fractions Prescribed: 16
Plan Total Prescribed Dose: 42.56 Gy
Reference Point Dosage Given to Date: 7.98 Gy
Reference Point Session Dosage Given: 2.66 Gy
Session Number: 3

## 2023-08-22 ENCOUNTER — Ambulatory Visit
Admission: RE | Admit: 2023-08-22 | Discharge: 2023-08-22 | Disposition: A | Discharge status: Home / Self Care | Payer: Medicare Other | Source: Ambulatory Visit | Attending: Radiation Oncology | Admitting: Radiation Oncology

## 2023-08-22 ENCOUNTER — Other Ambulatory Visit: Payer: Self-pay

## 2023-08-22 DIAGNOSIS — D0512 Intraductal carcinoma in situ of left breast: Secondary | ICD-10-CM | POA: Diagnosis not present

## 2023-08-22 LAB — RAD ONC ARIA SESSION SUMMARY
Course Elapsed Days: 3
Plan Fractions Treated to Date: 4
Plan Prescribed Dose Per Fraction: 2.66 Gy
Plan Total Fractions Prescribed: 16
Plan Total Prescribed Dose: 42.56 Gy
Reference Point Dosage Given to Date: 10.64 Gy
Reference Point Session Dosage Given: 2.66 Gy
Session Number: 4

## 2023-08-23 ENCOUNTER — Ambulatory Visit
Admission: RE | Admit: 2023-08-23 | Discharge: 2023-08-23 | Disposition: A | Discharge status: Home / Self Care | Payer: Medicare Other | Source: Ambulatory Visit | Attending: Radiation Oncology | Admitting: Radiation Oncology

## 2023-08-23 ENCOUNTER — Other Ambulatory Visit: Payer: Self-pay

## 2023-08-23 DIAGNOSIS — D0512 Intraductal carcinoma in situ of left breast: Secondary | ICD-10-CM

## 2023-08-23 LAB — RAD ONC ARIA SESSION SUMMARY
Course Elapsed Days: 4
Plan Fractions Treated to Date: 5
Plan Prescribed Dose Per Fraction: 2.66 Gy
Plan Total Fractions Prescribed: 16
Plan Total Prescribed Dose: 42.56 Gy
Reference Point Dosage Given to Date: 13.3 Gy
Reference Point Session Dosage Given: 2.66 Gy
Session Number: 5

## 2023-08-23 MED ORDER — RADIAPLEXRX EX GEL: Freq: Once | CUTANEOUS | Status: AC

## 2023-08-23 MED ORDER — ALRA NON-METALLIC DEODORANT (RAD-ONC)
1.0000 | Freq: Once | TOPICAL | Status: AC
  Administered 2023-08-23: 1 via TOPICAL

## 2023-08-26 ENCOUNTER — Other Ambulatory Visit: Payer: Self-pay

## 2023-08-26 ENCOUNTER — Ambulatory Visit
Admission: RE | Admit: 2023-08-26 | Discharge: 2023-08-26 | Disposition: A | Discharge status: Home / Self Care | Payer: Medicare Other | Source: Ambulatory Visit | Attending: Radiation Oncology | Admitting: Radiation Oncology

## 2023-08-26 DIAGNOSIS — D0512 Intraductal carcinoma in situ of left breast: Secondary | ICD-10-CM | POA: Diagnosis not present

## 2023-08-26 LAB — RAD ONC ARIA SESSION SUMMARY
Course Elapsed Days: 7
Plan Fractions Treated to Date: 6
Plan Prescribed Dose Per Fraction: 2.66 Gy
Plan Total Fractions Prescribed: 16
Plan Total Prescribed Dose: 42.56 Gy
Reference Point Dosage Given to Date: 15.96 Gy
Reference Point Session Dosage Given: 2.66 Gy
Session Number: 6

## 2023-08-27 ENCOUNTER — Other Ambulatory Visit: Payer: Self-pay

## 2023-08-27 ENCOUNTER — Ambulatory Visit
Admission: RE | Admit: 2023-08-27 | Discharge: 2023-08-27 | Disposition: A | Discharge status: Home / Self Care | Payer: Medicare Other | Source: Ambulatory Visit | Attending: Radiation Oncology

## 2023-08-27 DIAGNOSIS — D0512 Intraductal carcinoma in situ of left breast: Secondary | ICD-10-CM | POA: Diagnosis not present

## 2023-08-27 LAB — RAD ONC ARIA SESSION SUMMARY
Course Elapsed Days: 8
Plan Fractions Treated to Date: 7
Plan Prescribed Dose Per Fraction: 2.66 Gy
Plan Total Fractions Prescribed: 16
Plan Total Prescribed Dose: 42.56 Gy
Reference Point Dosage Given to Date: 18.62 Gy
Reference Point Session Dosage Given: 2.66 Gy
Session Number: 7

## 2023-08-28 ENCOUNTER — Other Ambulatory Visit: Payer: Self-pay

## 2023-08-28 ENCOUNTER — Ambulatory Visit
Admission: RE | Admit: 2023-08-28 | Discharge: 2023-08-28 | Disposition: A | Discharge status: Home / Self Care | Payer: Medicare Other | Source: Ambulatory Visit | Attending: Radiation Oncology | Admitting: Radiation Oncology

## 2023-08-28 DIAGNOSIS — D0512 Intraductal carcinoma in situ of left breast: Secondary | ICD-10-CM | POA: Diagnosis not present

## 2023-08-28 LAB — RAD ONC ARIA SESSION SUMMARY
Course Elapsed Days: 9
Plan Fractions Treated to Date: 8
Plan Prescribed Dose Per Fraction: 2.66 Gy
Plan Total Fractions Prescribed: 16
Plan Total Prescribed Dose: 42.56 Gy
Reference Point Dosage Given to Date: 21.28 Gy
Reference Point Session Dosage Given: 2.66 Gy
Session Number: 8

## 2023-08-29 ENCOUNTER — Other Ambulatory Visit: Payer: Self-pay

## 2023-08-29 ENCOUNTER — Ambulatory Visit
Admission: RE | Admit: 2023-08-29 | Discharge: 2023-08-29 | Disposition: A | Discharge status: Home / Self Care | Payer: Medicare Other | Source: Ambulatory Visit | Attending: Radiation Oncology | Admitting: Radiation Oncology

## 2023-08-29 DIAGNOSIS — D0512 Intraductal carcinoma in situ of left breast: Secondary | ICD-10-CM | POA: Diagnosis not present

## 2023-08-29 LAB — RAD ONC ARIA SESSION SUMMARY
Course Elapsed Days: 10
Plan Fractions Treated to Date: 9
Plan Prescribed Dose Per Fraction: 2.66 Gy
Plan Total Fractions Prescribed: 16
Plan Total Prescribed Dose: 42.56 Gy
Reference Point Dosage Given to Date: 23.94 Gy
Reference Point Session Dosage Given: 2.66 Gy
Session Number: 9

## 2023-08-30 ENCOUNTER — Other Ambulatory Visit: Payer: Self-pay

## 2023-08-30 ENCOUNTER — Ambulatory Visit
Admission: RE | Admit: 2023-08-30 | Discharge: 2023-08-30 | Disposition: A | Discharge status: Home / Self Care | Payer: Medicare Other | Source: Ambulatory Visit | Attending: Radiation Oncology | Admitting: Radiation Oncology

## 2023-08-30 DIAGNOSIS — D0512 Intraductal carcinoma in situ of left breast: Secondary | ICD-10-CM | POA: Diagnosis not present

## 2023-08-30 LAB — RAD ONC ARIA SESSION SUMMARY
Course Elapsed Days: 11
Plan Fractions Treated to Date: 10
Plan Prescribed Dose Per Fraction: 2.66 Gy
Plan Total Fractions Prescribed: 16
Plan Total Prescribed Dose: 42.56 Gy
Reference Point Dosage Given to Date: 26.6 Gy
Reference Point Session Dosage Given: 2.66 Gy
Session Number: 10

## 2023-09-02 ENCOUNTER — Other Ambulatory Visit: Payer: Self-pay

## 2023-09-02 ENCOUNTER — Ambulatory Visit
Admission: RE | Admit: 2023-09-02 | Discharge: 2023-09-02 | Disposition: A | Discharge status: Home / Self Care | Payer: Medicare Other | Source: Ambulatory Visit | Attending: Radiation Oncology

## 2023-09-02 DIAGNOSIS — D0512 Intraductal carcinoma in situ of left breast: Secondary | ICD-10-CM | POA: Diagnosis not present

## 2023-09-02 LAB — RAD ONC ARIA SESSION SUMMARY
Course Elapsed Days: 14
Plan Fractions Treated to Date: 11
Plan Prescribed Dose Per Fraction: 2.66 Gy
Plan Total Fractions Prescribed: 16
Plan Total Prescribed Dose: 42.56 Gy
Reference Point Dosage Given to Date: 29.26 Gy
Reference Point Session Dosage Given: 2.66 Gy
Session Number: 11

## 2023-09-03 ENCOUNTER — Ambulatory Visit
Admission: RE | Admit: 2023-09-03 | Discharge: 2023-09-03 | Disposition: A | Discharge status: Home / Self Care | Payer: Medicare Other | Source: Ambulatory Visit | Attending: Radiation Oncology

## 2023-09-03 ENCOUNTER — Other Ambulatory Visit: Payer: Self-pay

## 2023-09-03 DIAGNOSIS — D0512 Intraductal carcinoma in situ of left breast: Secondary | ICD-10-CM | POA: Diagnosis not present

## 2023-09-03 LAB — RAD ONC ARIA SESSION SUMMARY
Course Elapsed Days: 15
Plan Fractions Treated to Date: 12
Plan Prescribed Dose Per Fraction: 2.66 Gy
Plan Total Fractions Prescribed: 16
Plan Total Prescribed Dose: 42.56 Gy
Reference Point Dosage Given to Date: 31.92 Gy
Reference Point Session Dosage Given: 2.66 Gy
Session Number: 12

## 2023-09-04 ENCOUNTER — Other Ambulatory Visit: Payer: Self-pay

## 2023-09-04 ENCOUNTER — Ambulatory Visit
Admission: RE | Admit: 2023-09-04 | Discharge: 2023-09-04 | Disposition: A | Discharge status: Home / Self Care | Payer: Medicare Other | Source: Ambulatory Visit | Attending: Radiation Oncology | Admitting: Radiation Oncology

## 2023-09-04 DIAGNOSIS — D0512 Intraductal carcinoma in situ of left breast: Secondary | ICD-10-CM | POA: Diagnosis not present

## 2023-09-04 LAB — RAD ONC ARIA SESSION SUMMARY
Course Elapsed Days: 16
Plan Fractions Treated to Date: 13
Plan Prescribed Dose Per Fraction: 2.66 Gy
Plan Total Fractions Prescribed: 16
Plan Total Prescribed Dose: 42.56 Gy
Reference Point Dosage Given to Date: 34.58 Gy
Reference Point Session Dosage Given: 2.66 Gy
Session Number: 13

## 2023-09-05 ENCOUNTER — Other Ambulatory Visit: Payer: Self-pay

## 2023-09-05 ENCOUNTER — Ambulatory Visit
Admission: RE | Admit: 2023-09-05 | Discharge: 2023-09-05 | Disposition: A | Discharge status: Home / Self Care | Payer: Medicare Other | Source: Ambulatory Visit | Attending: Radiation Oncology | Admitting: Radiation Oncology

## 2023-09-05 DIAGNOSIS — D0512 Intraductal carcinoma in situ of left breast: Secondary | ICD-10-CM | POA: Diagnosis not present

## 2023-09-05 LAB — RAD ONC ARIA SESSION SUMMARY
Course Elapsed Days: 17
Plan Fractions Treated to Date: 14
Plan Prescribed Dose Per Fraction: 2.66 Gy
Plan Total Fractions Prescribed: 16
Plan Total Prescribed Dose: 42.56 Gy
Reference Point Dosage Given to Date: 37.24 Gy
Reference Point Session Dosage Given: 2.66 Gy
Session Number: 14

## 2023-09-06 ENCOUNTER — Ambulatory Visit
Admission: RE | Admit: 2023-09-06 | Discharge: 2023-09-06 | Disposition: A | Discharge status: Home / Self Care | Payer: Medicare Other | Source: Ambulatory Visit | Attending: Radiation Oncology

## 2023-09-06 ENCOUNTER — Ambulatory Visit: Payer: Medicare Other | Admitting: Radiation Oncology

## 2023-09-06 ENCOUNTER — Other Ambulatory Visit: Payer: Self-pay

## 2023-09-06 ENCOUNTER — Ambulatory Visit
Admission: RE | Admit: 2023-09-06 | Discharge: 2023-09-06 | Disposition: A | Discharge status: Home / Self Care | Payer: Medicare Other | Source: Ambulatory Visit | Attending: Radiation Oncology | Admitting: Radiation Oncology

## 2023-09-06 DIAGNOSIS — D0512 Intraductal carcinoma in situ of left breast: Secondary | ICD-10-CM

## 2023-09-06 LAB — RAD ONC ARIA SESSION SUMMARY
Course Elapsed Days: 18
Plan Fractions Treated to Date: 15
Plan Prescribed Dose Per Fraction: 2.66 Gy
Plan Total Fractions Prescribed: 16
Plan Total Prescribed Dose: 42.56 Gy
Reference Point Dosage Given to Date: 39.9 Gy
Reference Point Session Dosage Given: 2.66 Gy
Session Number: 15

## 2023-09-06 MED ORDER — RADIAPLEXRX EX GEL: Freq: Once | CUTANEOUS | Status: AC

## 2023-09-09 ENCOUNTER — Other Ambulatory Visit: Payer: Self-pay

## 2023-09-09 ENCOUNTER — Ambulatory Visit
Admission: RE | Admit: 2023-09-09 | Discharge: 2023-09-09 | Disposition: A | Discharge status: Home / Self Care | Payer: Medicare Other | Source: Ambulatory Visit | Attending: Radiation Oncology | Admitting: Radiation Oncology

## 2023-09-09 DIAGNOSIS — D0512 Intraductal carcinoma in situ of left breast: Secondary | ICD-10-CM | POA: Diagnosis not present

## 2023-09-09 LAB — RAD ONC ARIA SESSION SUMMARY
Course Elapsed Days: 21
Plan Fractions Treated to Date: 16
Plan Prescribed Dose Per Fraction: 2.66 Gy
Plan Total Fractions Prescribed: 16
Plan Total Prescribed Dose: 42.56 Gy
Reference Point Dosage Given to Date: 42.56 Gy
Reference Point Session Dosage Given: 2.66 Gy
Session Number: 16

## 2023-09-10 ENCOUNTER — Other Ambulatory Visit: Payer: Self-pay

## 2023-09-10 ENCOUNTER — Ambulatory Visit
Admission: RE | Admit: 2023-09-10 | Discharge: 2023-09-10 | Disposition: A | Discharge status: Home / Self Care | Payer: Medicare Other | Source: Ambulatory Visit | Attending: Radiation Oncology | Admitting: Radiation Oncology

## 2023-09-10 DIAGNOSIS — D0512 Intraductal carcinoma in situ of left breast: Secondary | ICD-10-CM | POA: Diagnosis not present

## 2023-09-10 LAB — RAD ONC ARIA SESSION SUMMARY
Course Elapsed Days: 22
Plan Fractions Treated to Date: 1
Plan Prescribed Dose Per Fraction: 2 Gy
Plan Total Fractions Prescribed: 4
Plan Total Prescribed Dose: 8 Gy
Reference Point Dosage Given to Date: 2 Gy
Reference Point Session Dosage Given: 2 Gy
Session Number: 17

## 2023-09-11 ENCOUNTER — Other Ambulatory Visit: Payer: Self-pay

## 2023-09-11 ENCOUNTER — Ambulatory Visit
Admission: RE | Admit: 2023-09-11 | Discharge: 2023-09-11 | Disposition: A | Discharge status: Home / Self Care | Payer: Medicare Other | Source: Ambulatory Visit | Attending: Radiation Oncology

## 2023-09-11 DIAGNOSIS — D0512 Intraductal carcinoma in situ of left breast: Secondary | ICD-10-CM | POA: Diagnosis not present

## 2023-09-11 LAB — RAD ONC ARIA SESSION SUMMARY
Course Elapsed Days: 23
Plan Fractions Treated to Date: 2
Plan Prescribed Dose Per Fraction: 2 Gy
Plan Total Fractions Prescribed: 4
Plan Total Prescribed Dose: 8 Gy
Reference Point Dosage Given to Date: 4 Gy
Reference Point Session Dosage Given: 2 Gy
Session Number: 18

## 2023-09-12 ENCOUNTER — Ambulatory Visit
Admission: RE | Admit: 2023-09-12 | Discharge: 2023-09-12 | Disposition: A | Discharge status: Home / Self Care | Payer: Medicare Other | Source: Ambulatory Visit | Attending: Radiation Oncology | Admitting: Radiation Oncology

## 2023-09-12 ENCOUNTER — Other Ambulatory Visit: Payer: Self-pay

## 2023-09-12 DIAGNOSIS — D0512 Intraductal carcinoma in situ of left breast: Secondary | ICD-10-CM | POA: Diagnosis not present

## 2023-09-12 LAB — RAD ONC ARIA SESSION SUMMARY
Course Elapsed Days: 24
Plan Fractions Treated to Date: 3
Plan Prescribed Dose Per Fraction: 2 Gy
Plan Total Fractions Prescribed: 4
Plan Total Prescribed Dose: 8 Gy
Reference Point Dosage Given to Date: 6 Gy
Reference Point Session Dosage Given: 2 Gy
Session Number: 19

## 2023-09-13 ENCOUNTER — Other Ambulatory Visit: Payer: Self-pay

## 2023-09-13 ENCOUNTER — Ambulatory Visit
Admission: RE | Admit: 2023-09-13 | Discharge: 2023-09-13 | Disposition: A | Discharge status: Home / Self Care | Payer: Medicare Other | Source: Ambulatory Visit | Attending: Radiation Oncology

## 2023-09-13 DIAGNOSIS — D0512 Intraductal carcinoma in situ of left breast: Secondary | ICD-10-CM | POA: Diagnosis not present

## 2023-09-13 LAB — RAD ONC ARIA SESSION SUMMARY
Course Elapsed Days: 25
Plan Fractions Treated to Date: 4
Plan Prescribed Dose Per Fraction: 2 Gy
Plan Total Fractions Prescribed: 4
Plan Total Prescribed Dose: 8 Gy
Reference Point Dosage Given to Date: 8 Gy
Reference Point Session Dosage Given: 2 Gy
Session Number: 20

## 2023-09-16 NOTE — Radiation Completion Notes (Signed)
Radiation Oncology         [phone] ________________________________  Name: Glenda Hill MRN: [id number]  Date of Service: 09/13/2023  DOB: 01/06/1956  End of Treatment Note     Diagnosis: Intermediate grade ER/PR positive DCIS of the left breast   Intent: Curative     ==========DELIVERED PLANS==========  First Treatment Date: 2023-08-19 - Last Treatment Date: 2023-09-13   Plan Name: Breast_L_BH Site: Breast, Left Technique: 3D Mode: Photon Dose Per Fraction: 2.66 Gy Prescribed Dose (Delivered / Prescribed): 42.56 Gy / 42.56 Gy Prescribed Fxs (Delivered / Prescribed): 16 / 16   Plan Name: Brst_L_BH_Bst Site: Breast, Left Technique: 3D Mode: Photon Dose Per Fraction: 2 Gy Prescribed Dose (Delivered / Prescribed): 8 Gy / 8 Gy Prescribed Fxs (Delivered / Prescribed): 4 / 4     ==========ON TREATMENT VISIT DATES========== 2023-08-23, 2023-08-30, 2023-09-06, 2023-09-13   See weekly On Treatment Notes in Epic for details. The patient tolerated radiation. She developed fatigue and anticipated skin changes in the treatment field.   The patient will receive a call in about one month from the radiation oncology department. She will continue follow up with Dr. Pamelia Hoit as well.      Osker Mason, PAC

## 2023-09-17 ENCOUNTER — Telehealth: Payer: Self-pay | Admitting: Hematology and Oncology

## 2023-09-20 ENCOUNTER — Inpatient Hospital Stay: Payer: Medicare Other | Attending: Hematology and Oncology | Admitting: Hematology and Oncology

## 2023-09-20 VITALS — BP 137/77 | HR 97 | Temp 97.7°F | Resp 18 | Ht 69.0 in | Wt 180.8 lb

## 2023-09-20 DIAGNOSIS — Z170 Estrogen receptor positive status [ER+]: Secondary | ICD-10-CM | POA: Insufficient documentation

## 2023-09-20 DIAGNOSIS — Z923 Personal history of irradiation: Secondary | ICD-10-CM | POA: Diagnosis not present

## 2023-09-20 DIAGNOSIS — Z1721 Progesterone receptor positive status: Secondary | ICD-10-CM | POA: Diagnosis not present

## 2023-09-20 DIAGNOSIS — Z933 Colostomy status: Secondary | ICD-10-CM | POA: Insufficient documentation

## 2023-09-20 DIAGNOSIS — D0512 Intraductal carcinoma in situ of left breast: Secondary | ICD-10-CM | POA: Insufficient documentation

## 2023-09-20 MED ORDER — TAMOXIFEN CITRATE 10 MG PO TABS: 10.0000 mg | ORAL_TABLET | Freq: Every day | ORAL | 3 refills | Status: DC

## 2023-09-20 NOTE — Progress Notes (Signed)
Patient Care Team: Ignatius Specking, MD as PCP - General (Internal Medicine) Shelly Coss, MD as Referring Physician (Surgery) Wendall Stade, MD as Consulting Physician (Cardiology) Nyoka Cowden, MD as Consulting Physician (Pulmonary Disease) Shirlean Kelly, MD as Attending Physician (Neurosurgery) Karie Soda, MD as Consulting Physician (Colon and Rectal Surgery) Dolores Frame, MD as Consulting Physician (Gastroenterology) Pershing Proud, RN as Oncology Nurse Navigator Donnelly Angelica, RN as Oncology Nurse Navigator Serena Croissant, MD as Consulting Physician (Hematology and Oncology) Harriette Bouillon, MD as Consulting Physician (General Surgery) Dorothy Puffer, MD as Consulting Physician (Radiation Oncology)  DIAGNOSIS:  Encounter Diagnosis  Name Primary?   Ductal carcinoma in situ (DCIS) of left breast Yes    SUMMARY OF ONCOLOGIC HISTORY: Oncology History  Ductal carcinoma in situ (DCIS) of left breast  06/03/2023 Initial Diagnosis   Screening mammogram detected left breast distortion 1 cm, biopsy revealed intermediate grade DCIS ER 100%, PR 60%   07/03/2023 Cancer Staging   Staging form: Breast, AJCC 8th Edition - Clinical: Stage 0 (cTis (DCIS), cN0, cM0, G2, ER+, PR+, HER2: Not Assessed) - Signed by Serena Croissant, MD on 07/03/2023 Stage prefix: Initial diagnosis Histologic grading system: 3 grade system   07/04/2023 Surgery   Left lumpectomy: Scattered foci of DCIS intermediate grade discontinuously involving a fibrotic area of about 1 cm, margins negative, ER 100%, PR 60%   07/16/2023 Genetic Testing   Negative [employer] Common Hereditary Cancers +RNA Panel.  Report date is 07/16/2023.    The [employer] Common Hereditary Cancers + RNA Panel includes sequencing, deletion/duplication, and RNA analysis of the following 48 genes: APC, ATM, AXIN2, BAP1, BARD1, BMPR1A, BRCA1, BRCA2, BRIP1, CDH1, CDK4*, CDKN2A*, CHEK2, CTNNA1, DICER1, EPCAM* (del/dup only), FH,  GREM1* (promoter dup analysis only), HOXB13*, KIT*, MBD4*, MEN1, MLH1, MSH2, MSH3, MSH6, MUTYH, NF1, NTHL1, PALB2, PDGFRA*, PMS2, POLD1, POLE, PTEN, RAD51C, RAD51D, SDHA (sequencing only), SDHB, SDHC, SDHD, SMAD4, SMARCA4, STK11, TP53, TSC1, TSC2, VHL.  *Genes without RNA analysis.      CHIEF COMPLIANT: Follow-up of DCIS status post surgery  History of Present Illness   The patient, with a history of breast cancer, presents for a follow-up visit after completing radiation therapy. She reports that the surgical site is healing, but she is experiencing significant itching despite using lotion.  The patient also has a colostomy bag and is interested in pursuing a reversal now that radiation therapy is complete. She was previously told she could proceed with this once radiation was finished.  In addition, the patient has been prescribed tamoxifen to reduce the risk of cancer recurrence.       ALLERGIES:  has No Known Allergies.  MEDICATIONS:  Current Outpatient Medications  Medication Sig Dispense Refill   tamoxifen (NOLVADEX) 10 MG tablet Take 1 tablet (10 mg total) by mouth daily. 90 tablet 3   amLODipine (NORVASC) 10 MG tablet Take 10 mg by mouth daily.     Calcium Carb-Cholecalciferol 600-20 MG-MCG TABS Take 600 mg by mouth 2 (two) times daily.     candesartan-hydrochlorothiazide (ATACAND HCT) 32-12.5 MG tablet Take 1 tablet by mouth daily.     carvedilol (COREG) 12.5 MG tablet Take 12.5 mg by mouth 2 (two) times daily.     ibuprofen (ADVIL,MOTRIN) 200 MG tablet Take 400 mg by mouth every 6 (six) hours as needed for headache (pain).     Multiple Vitamin (MULTIVITAMIN WITH MINERALS) TABS tablet Take 1 tablet by mouth daily. Woman     oxyCODONE (OXY  IR/ROXICODONE) 5 MG immediate release tablet Take 1 tablet (5 mg total) by mouth every 6 (six) hours as needed for severe pain. 15 tablet 0   pantoprazole (PROTONIX) 40 MG tablet Take 40 mg by mouth daily.     TRELEGY ELLIPTA 100-62.5-25 MCG/ACT  AEPB Inhale 1 puff into the lungs daily.     Vitamin D, Ergocalciferol, (DRISDOL) 1.25 MG (50000 UNIT) CAPS capsule Take 50,000 Units by mouth once a week.     No current facility-administered medications for this visit.    PHYSICAL EXAMINATION: ECOG PERFORMANCE STATUS: 1 - Symptomatic but completely ambulatory  Vitals:   09/20/23 1045  BP: 137/77  Pulse: 97  Resp: 18  Temp: 97.7 F (36.5 C)  SpO2: 97%   Filed Weights   09/20/23 1045  Weight: 180 lb 12.8 oz (82 kg)     LABORATORY DATA:  I have reviewed the data as listed    Latest Ref Rng & Units 06/27/2023    2:14 PM 05/17/2017    3:23 PM 12/25/2015    4:50 PM  CMP  Glucose 70 - 99 mg/dL 027  253  98   BUN 8 - 23 mg/dL 16  8  10    Creatinine 0.44 - 1.00 mg/dL 6.64  4.03  4.74   Sodium 135 - 145 mmol/L 137  134  140   Potassium 3.5 - 5.1 mmol/L 3.5  3.9  3.8   Chloride 98 - 111 mmol/L 97  98  99   CO2 22 - 32 mmol/L 29  27  25    Calcium 8.9 - 10.3 mg/dL 9.7  9.7  9.7     Lab Results  Component Value Date   WBC 9.0 06/27/2023   HGB 14.3 06/27/2023   HCT 42.5 06/27/2023   MCV 94.2 06/27/2023   PLT 272 06/27/2023   NEUTROABS 2.8 12/28/2014    ASSESSMENT & PLAN:  Ductal carcinoma in situ (DCIS) of left breast 07/04/2023: Left lumpectomy: Scattered foci of DCIS intermediate grade discontinuously involving a fibrotic area of about 1 cm, margins negative, ER 100%, PR 60%    Treatment plan: adjuvant radiation therapy: Completed 09/13/2023 Followed by antiestrogen therapy with tamoxifen 5 years   Tamoxifen counseling: We discussed the risks and benefits of tamoxifen. These include but not limited to insomnia, hot flashes, mood changes, vaginal dryness, and weight gain. Although rare, serious side effects including endometrial cancer, risk of blood clots were also discussed. We strongly believe that the benefits far outweigh the risks. Patient understands these risks and consented to starting treatment. Planned treatment  duration is 5 years.  I recommend using low-dose tamoxifen 10 mg daily based upon TAM-01 clinical trial. 1-month follow-up for survivorship care plan visit  No orders of the defined types were placed in this encounter.  The patient has a good understanding of the overall plan. she agrees with it. she will call with any problems that may develop before the next visit here. Total time spent: 30 mins including face to face time and time spent for planning, charting and co-ordination of care   Tamsen Meek, MD 09/20/23

## 2023-09-20 NOTE — Assessment & Plan Note (Signed)
07/04/2023: Left lumpectomy: Scattered foci of DCIS intermediate grade discontinuously involving a fibrotic area of about 1 cm, margins negative, ER 100%, PR 60%    Treatment plan: adjuvant radiation therapy: Completed 09/13/2023 Followed by antiestrogen therapy with tamoxifen 5 years   Tamoxifen counseling: We discussed the risks and benefits of tamoxifen. These include but not limited to insomnia, hot flashes, mood changes, vaginal dryness, and weight gain. Although rare, serious side effects including endometrial cancer, risk of blood clots were also discussed. We strongly believe that the benefits far outweigh the risks. Patient understands these risks and consented to starting treatment. Planned treatment duration is 5 years.  I recommend using low-dose tamoxifen 10 mg daily based upon TAM-01 clinical trial. 85-month follow-up for survivorship care plan visit

## 2023-10-14 ENCOUNTER — Ambulatory Visit
Admission: RE | Admit: 2023-10-14 | Discharge: 2023-10-14 | Disposition: A | Discharge status: Home / Self Care | Payer: Medicare Other | Source: Ambulatory Visit | Attending: Adult Health | Admitting: Adult Health

## 2023-10-14 DIAGNOSIS — D0512 Intraductal carcinoma in situ of left breast: Secondary | ICD-10-CM | POA: Insufficient documentation

## 2023-10-14 NOTE — Progress Notes (Signed)
  Radiation Oncology         [phone] ________________________________  Name: Glenda Hill MRN: [id number]  Date of Service: 10/14/2023  DOB: [birth year]  Post Treatment Telephone Note  Diagnosis:  Intermediate grade ER/PR positive DCIS of the left breast (as documented in provider EOT note)  The patient was not available for call today. Voicemail left.   The patient was encouraged to avoid sun exposure in the area of prior treatment for up to one year following radiation with either sunscreen or by the style of clothing worn in the sun.  The patient has scheduled follow up with her medical oncologist Dr. Pamelia Hoit for ongoing surveillance, and was encouraged to call if she develops concerns or questions regarding radiation.   Ruel Favors, LPN

## 2023-11-14 ENCOUNTER — Encounter (INDEPENDENT_AMBULATORY_CARE_PROVIDER_SITE_OTHER): Payer: Self-pay | Admitting: *Deleted

## 2023-12-23 ENCOUNTER — Inpatient Hospital Stay: Payer: Medicare Other | Attending: Hematology and Oncology | Admitting: Adult Health

## 2023-12-23 ENCOUNTER — Telehealth: Payer: Self-pay

## 2023-12-23 ENCOUNTER — Encounter: Payer: Self-pay | Admitting: Adult Health

## 2023-12-23 ENCOUNTER — Encounter: Payer: Self-pay | Admitting: *Deleted

## 2023-12-23 VITALS — BP 147/83 | HR 77 | Temp 98.0°F | Resp 16 | Wt 181.7 lb

## 2023-12-23 DIAGNOSIS — R918 Other nonspecific abnormal finding of lung field: Secondary | ICD-10-CM | POA: Diagnosis not present

## 2023-12-23 DIAGNOSIS — Z8041 Family history of malignant neoplasm of ovary: Secondary | ICD-10-CM | POA: Diagnosis not present

## 2023-12-23 DIAGNOSIS — Z1721 Progesterone receptor positive status: Secondary | ICD-10-CM | POA: Diagnosis not present

## 2023-12-23 DIAGNOSIS — Z8049 Family history of malignant neoplasm of other genital organs: Secondary | ICD-10-CM | POA: Insufficient documentation

## 2023-12-23 DIAGNOSIS — Z923 Personal history of irradiation: Secondary | ICD-10-CM | POA: Diagnosis not present

## 2023-12-23 DIAGNOSIS — Z801 Family history of malignant neoplasm of trachea, bronchus and lung: Secondary | ICD-10-CM | POA: Diagnosis not present

## 2023-12-23 DIAGNOSIS — Z170 Estrogen receptor positive status [ER+]: Secondary | ICD-10-CM | POA: Diagnosis not present

## 2023-12-23 DIAGNOSIS — Z800 Family history of malignant neoplasm of digestive organs: Secondary | ICD-10-CM | POA: Insufficient documentation

## 2023-12-23 DIAGNOSIS — Z79810 Long term (current) use of selective estrogen receptor modulators (SERMs): Secondary | ICD-10-CM | POA: Insufficient documentation

## 2023-12-23 DIAGNOSIS — D0512 Intraductal carcinoma in situ of left breast: Secondary | ICD-10-CM | POA: Insufficient documentation

## 2023-12-23 DIAGNOSIS — F17210 Nicotine dependence, cigarettes, uncomplicated: Secondary | ICD-10-CM | POA: Insufficient documentation

## 2023-12-23 NOTE — Progress Notes (Signed)
Called pt to confirm today's SCP appt. Pt is aware of appt and says she will be there.

## 2023-12-23 NOTE — Telephone Encounter (Signed)
MM report was faxed to [phone] confirmation was confirmed successfully.

## 2023-12-23 NOTE — Progress Notes (Signed)
SURVIVORSHIP VISIT:  BRIEF ONCOLOGIC HISTORY:  Oncology History  Ductal carcinoma in situ (DCIS) of left breast  06/03/2023 Initial Diagnosis   Screening mammogram detected left breast distortion 1 cm, biopsy revealed intermediate grade DCIS ER 100%, PR 60%   07/03/2023 Cancer Staging   Staging form: Breast, AJCC 8th Edition - Clinical: Stage 0 (cTis (DCIS), cN0, cM0, G2, ER+, PR+, HER2: Not Assessed) - Signed by Serena Croissant, MD on 07/03/2023 Stage prefix: Initial diagnosis Histologic grading system: 3 grade system   07/04/2023 Surgery   Left lumpectomy: Scattered foci of DCIS intermediate grade discontinuously involving a fibrotic area of about 1 cm, margins negative, ER 100%, PR 60%   07/16/2023 Genetic Testing   Negative [facility] Common Hereditary Cancers +RNA Panel.  Report date is 07/16/2023.    The [facility] Common Hereditary Cancers + RNA Panel includes sequencing, deletion/duplication, and RNA analysis of the following 48 genes: APC, ATM, AXIN2, BAP1, BARD1, BMPR1A, BRCA1, BRCA2, BRIP1, CDH1, CDK4*, CDKN2A*, CHEK2, CTNNA1, DICER1, EPCAM* (del/dup only), FH, GREM1* (promoter dup analysis only), HOXB13*, KIT*, MBD4*, MEN1, MLH1, MSH2, MSH3, MSH6, MUTYH, NF1, NTHL1, PALB2, PDGFRA*, PMS2, POLD1, POLE, PTEN, RAD51C, RAD51D, SDHA (sequencing only), SDHB, SDHC, SDHD, SMAD4, SMARCA4, STK11, TP53, TSC1, TSC2, VHL.  *Genes without RNA analysis.    08/19/2023 - 09/13/2023 Radiation Therapy   Plan Name: Breast_L_BH Site: Breast, Left Technique: 3D Mode: Photon Dose Per Fraction: 2.66 Gy Prescribed Dose (Delivered / Prescribed): 42.56 Gy / 42.56 Gy Prescribed Fxs (Delivered / Prescribed): 16 / 16   Plan Name: Brst_L_BH_Bst Site: Breast, Left Technique: 3D Mode: Photon Dose Per Fraction: 2 Gy Prescribed Dose (Delivered / Prescribed): 8 Gy / 8 Gy Prescribed Fxs (Delivered / Prescribed): 4 / 4   09/2023 -  Anti-estrogen oral therapy   Tamoxifen x 5 years     INTERVAL HISTORY:  Ms.  Croker to review her survivorship care plan detailing her treatment course for breast cancer, as well as monitoring long-term side effects of that treatment, education regarding health maintenance, screening, and overall wellness and health promotion.     Overall, Glenda Hill reports feeling quite well. She is taking Tamoxifen daily with good tolerance.  She is currently smoking cigarettes every day.  She has smoked on average 1 pack a day for the past 51 years.  She is not yet ready to quit.  REVIEW OF SYSTEMS:  Review of Systems  Constitutional:  Negative for appetite change, chills, fatigue, fever and unexpected weight change.  HENT:   Negative for hearing loss, lump/mass and trouble swallowing.   Eyes:  Negative for eye problems and icterus.  Respiratory:  Negative for chest tightness, cough and shortness of breath.   Cardiovascular:  Negative for chest pain, leg swelling and palpitations.  Gastrointestinal:  Negative for abdominal distention, abdominal pain, constipation, diarrhea, nausea and vomiting.  Endocrine: Negative for hot flashes.  Genitourinary:  Negative for difficulty urinating.   Musculoskeletal:  Negative for arthralgias.  Skin:  Negative for itching and rash.  Neurological:  Negative for dizziness, extremity weakness, headaches and numbness.  Hematological:  Negative for adenopathy. Does not bruise/bleed easily.  Psychiatric/Behavioral:  Negative for depression. The patient is not nervous/anxious.   Breast: Denies any new nodularity, masses, tenderness, nipple changes, or nipple discharge.       PAST MEDICAL/SURGICAL HISTORY:  Past Medical History:  Diagnosis Date   Complication of anesthesia    trouble waking up after C section   Ductal carcinoma in situ (DCIS) of left breast 2024  GERD (gastroesophageal reflux disease)    Hypertension    Past Surgical History:  Procedure Laterality Date   BACK SURGERY     x2 2014 and 2018   BREAST BIOPSY Left 06/03/2023    MM LT BREAST BX W LOC DEV 1ST LESION IMAGE BX SPEC STEREO GUIDE 06/03/2023 [facility]   BREAST BIOPSY  07/02/2023   MM LT RADIOACTIVE SEED LOC MAMMO GUIDE 07/02/2023 [facility]   BREAST LUMPECTOMY WITH RADIOACTIVE SEED LOCALIZATION Left 07/04/2023   Procedure: LEFT BREAST LUMPECTOMY WITH RADIOACTIVE SEED LOCALIZATION;  Surgeon: Harriette Bouillon, MD;  Location: [facility];  Service: General;  Laterality: Left;   CESAREAN SECTION     TONSILLECTOMY     removed as a child     ALLERGIES:  No Known Allergies   CURRENT MEDICATIONS:  Outpatient Encounter Medications as of 12/23/2023  Medication Sig   amLODipine (NORVASC) 10 MG tablet Take 10 mg by mouth daily.   Calcium Carb-Cholecalciferol 600-20 MG-MCG TABS Take 600 mg by mouth 2 (two) times daily.   candesartan-hydrochlorothiazide (ATACAND HCT) 32-12.5 MG tablet Take 1 tablet by mouth daily.   carvedilol (COREG) 12.5 MG tablet Take 12.5 mg by mouth 2 (two) times daily.   Multiple Vitamin (MULTIVITAMIN WITH MINERALS) TABS tablet Take 1 tablet by mouth daily. Woman   pantoprazole (PROTONIX) 40 MG tablet Take 40 mg by mouth daily.   tamoxifen (NOLVADEX) 10 MG tablet Take 1 tablet (10 mg total) by mouth daily.   TRELEGY ELLIPTA 100-62.5-25 MCG/ACT AEPB Inhale 1 puff into the lungs daily.   Vitamin D, Ergocalciferol, (DRISDOL) 1.25 MG (50000 UNIT) CAPS capsule Take 50,000 Units by mouth once a week.   ibuprofen (ADVIL,MOTRIN) 200 MG tablet Take 400 mg by mouth every 6 (six) hours as needed for headache (pain). (Patient not taking: Reported on 12/23/2023)   [DISCONTINUED] oxyCODONE (OXY IR/ROXICODONE) 5 MG immediate release tablet Take 1 tablet (5 mg total) by mouth every 6 (six) hours as needed for severe pain.   No facility-administered encounter medications on file as of 12/23/2023.     ONCOLOGIC FAMILY HISTORY:  Family History  Problem Relation Age of Onset   Heart disease Mother    Lung cancer Mother 69       smoking hx    Emphysema Mother        smoking hx   Ovarian cancer Sister        dx 24-50   Cervical cancer Paternal Grandmother        dx unknown age   Arthritis Other    Lung disease Other    Diabetes Other    Cancer Cousin 60       stomach and lung; pat female cousin     SOCIAL HISTORY:  Social History   Socioeconomic History   Marital status: Significant Other    Spouse name: Not on file   Number of children: 1   Years of education: 11   Highest education level: Not on file  Occupational History   Not on file  Tobacco Use   Smoking status: Every Day    Current packs/day: 1.00    Average packs/day: 1 pack/day for 4.0 years (4.0 ttl pk-yrs)    Types: Cigarettes   Smokeless tobacco: Never   Tobacco comments:    "stopped for a while"   Vaping Use   Vaping status: Never Used  Substance and Sexual Activity   Alcohol use: Not Currently   Drug use: No   Sexual activity:  Not on file  Other Topics Concern   Not on file  Social History Narrative   Not on file   Social Drivers of Health   Financial Resource Strain: Not on file  Food Insecurity: No Food Insecurity (08/01/2023)   Hunger Vital Sign    Worried About Running Out of Food in the Last Year: Never true    Ran Out of Food in the Last Year: Never true  Transportation Needs: No Transportation Needs (08/01/2023)   PRAPARE - Administrator, Civil Service (Medical): No    Lack of Transportation (Non-Medical): No  Physical Activity: Not on file  Stress: Not on file  Social Connections: Not on file  Intimate Partner Violence: Not At Risk (08/01/2023)   Humiliation, Afraid, Rape, and Kick questionnaire    Fear of Current or Ex-Partner: No    Emotionally Abused: No    Physically Abused: No    Sexually Abused: No     OBSERVATIONS/OBJECTIVE:  BP (!) 147/83 (BP Location: Left Arm, Patient Position: Sitting)   Pulse 77   Temp 98 F (36.7 C) (Temporal)   Resp 16   Wt 181 lb 11.2 oz (82.4 kg)   SpO2 97%   BMI 26.83  kg/m  GENERAL: Patient is a well appearing female in no acute distress HEENT:  Sclerae anicteric.  Oropharynx clear and moist. No ulcerations or evidence of oropharyngeal candidiasis. Neck is supple.  NODES:  No cervical, supraclavicular, or axillary lymphadenopathy palpated.  BREAST EXAM: Left breast status postlumpectomy and radiation no sign of local recurrence right breast is benign. LUNGS:  Clear to auscultation bilaterally.  No wheezes or rhonchi. HEART:  Regular rate and rhythm. No murmur appreciated. ABDOMEN:  Soft, nontender.  Positive, normoactive bowel sounds. No organomegaly palpated. MSK:  No focal spinal tenderness to palpation. Full range of motion bilaterally in the upper extremities. EXTREMITIES:  No peripheral edema.   SKIN:  Clear with no obvious rashes or skin changes. No nail dyscrasia. NEURO:  Nonfocal. Well oriented.  Appropriate affect.   LABORATORY DATA:  None for this visit.  DIAGNOSTIC IMAGING:  None for this visit.      ASSESSMENT AND PLAN:  Ms.. Mapp is a pleasant 68 y.o. female with Stage 0 left breast DCIS, ER+/PR+, diagnosed in 05/2023, treated with lumpectomy, adjuvant radiation therapy, and anti-estrogen therapy with Tamoxifen beginning in 09/2023.  She presents to the Survivorship Clinic for our initial meeting and routine follow-up post-completion of treatment for breast cancer.    1. Stage 0 left breast cancer:  Glenda Hill is continuing to recover from definitive treatment for breast cancer. She will follow-up with her medical oncologist, Dr.  Pamelia Hoit in 6 months with history and physical exam per surveillance protocol.  She will continue her anti-estrogen therapy with tamoxifen. Thus far, she is tolerating the tamoxifen well, with minimal side effects. Her mammogram is due June 2025; orders placed today.   Today, a comprehensive survivorship care plan and treatment summary was reviewed with the patient today detailing her breast cancer diagnosis,  treatment course, potential late/long-term effects of treatment, appropriate follow-up care with recommendations for the future, and patient education resources.  A copy of this summary, along with a letter will be sent to the patient's primary care provider via mail/fax/In Basket message after today's visit.    2.  Current every day smoker: She is not yet ready to quit however I gave her information on tobacco cessation classes and OnDemand visit should she ever  decide to quit.  We also discussed links between smoking and the increased risk of cancer recurrence.  She had lung nodules noted on CT scan without contrast about 6 months ago.  I placed orders for repeat imaging in 1 months time.  3. Bone health:   She was given education on specific activities to promote bone health.  4. Cancer screening:  Due to Ms. Ramo's history and her age, she should receive screening for skin cancers, colon cancer, and gynecologic cancers.  The information and recommendations are listed on the patient's comprehensive care plan/treatment summary and were reviewed in detail with the patient.    5. Health maintenance and wellness promotion: Ms. Gomm was encouraged to consume 5-7 servings of fruits and vegetables per day. We reviewed the "Nutrition Rainbow" handout.  She was also encouraged to engage in moderate to vigorous exercise for 30 minutes per day most days of the week.  She was instructed to limit her alcohol consumption and was encouraged stop smoking.     6. Support services/counseling: It is not uncommon for this period of the patient's cancer care trajectory to be one of many emotions and stressors.   She was given information regarding our available services and encouraged to contact me with any questions or for help enrolling in any of our support group/programs.    Follow up instructions:    -Return to cancer center in 6 months for f/u  -Mammogram due in 04/2024 -CT chest in March 2025 -She is  welcome to return back to the Survivorship Clinic at any time; no additional follow-up needed at this time.  -Consider referral back to survivorship as a long-term survivor for continued surveillance  The patient was provided an opportunity to ask questions and all were answered. The patient agreed with the plan and demonstrated an understanding of the instructions.   Total encounter time:45 minutes*in face-to-face visit time, chart review, lab review, care coordination, order entry, and documentation of the encounter time.    Lillard Anes, NP 12/23/23 12:55 PM Medical Oncology and Hematology [facility] [address] [place], [place] [address] Tel. [phone]    Fax. [phone]  *Total Encounter Time as defined by the Centers for Medicare and Medicaid Services includes, in addition to the face-to-face time of a patient visit (documented in the note above) non-face-to-face time: obtaining and reviewing outside history, ordering and reviewing medications, tests or procedures, care coordination (communications with other health care professionals or caregivers) and documentation in the medical record.

## 2023-12-23 NOTE — Patient Instructions (Signed)
If you're ready to get started TODAY, consider scheduling a visit through [email or web address].  Appointments are available from 8am to 8pm, Monday to Friday.   Most health insurance plans will cover some level of tobacco cessation visits and medications.    Additional Resources: [employer] are also available to help you quit & provide the support you'll need. Many programs are available in both [place] and Spanish and have a long history of successfully helping people get off and stay off tobacco.    Quit Smoking Apps:  quitSTART at SeriousBroker.de QuitGuide?at ForgetParking.dk Online education and resources: Smokefree  at [employer].gov Free Telephone Coaching: QuitNow,  Call 1-800-QUIT-NOW (606-318-6172) or Text- Ready to (316) 166-6385 *[employer] has teamed up with Medicaid to offer a free 14 week program    Vaping- Want to Quit? Free 24/7 support. Call [facility]  [place], [place], [place], [place], [place]  [facility]

## 2024-01-17 ENCOUNTER — Ambulatory Visit (HOSPITAL_COMMUNITY): Payer: Medicare Other

## 2024-03-09 ENCOUNTER — Ambulatory Visit: Payer: Self-pay | Admitting: Surgery

## 2024-03-09 DIAGNOSIS — R739 Hyperglycemia, unspecified: Secondary | ICD-10-CM

## 2024-03-09 DIAGNOSIS — R931 Abnormal findings on diagnostic imaging of heart and coronary circulation: Secondary | ICD-10-CM | POA: Insufficient documentation

## 2024-03-09 DIAGNOSIS — Z801 Family history of malignant neoplasm of trachea, bronchus and lung: Secondary | ICD-10-CM | POA: Insufficient documentation

## 2024-03-09 DIAGNOSIS — K56699 Other intestinal obstruction unspecified as to partial versus complete obstruction: Principal | ICD-10-CM | POA: Diagnosis present

## 2024-03-13 ENCOUNTER — Encounter: Payer: Self-pay | Admitting: Internal Medicine

## 2024-03-17 ENCOUNTER — Ambulatory Visit (INDEPENDENT_AMBULATORY_CARE_PROVIDER_SITE_OTHER): Admitting: Gastroenterology

## 2024-03-17 ENCOUNTER — Encounter (INDEPENDENT_AMBULATORY_CARE_PROVIDER_SITE_OTHER): Payer: Self-pay | Admitting: Gastroenterology

## 2024-03-17 VITALS — BP 160/84 | HR 67 | Temp 98.1°F | Ht 69.0 in | Wt 184.2 lb

## 2024-03-17 DIAGNOSIS — Z791 Long term (current) use of non-steroidal anti-inflammatories (NSAID): Secondary | ICD-10-CM

## 2024-03-17 DIAGNOSIS — F17200 Nicotine dependence, unspecified, uncomplicated: Secondary | ICD-10-CM | POA: Insufficient documentation

## 2024-03-17 DIAGNOSIS — I10 Essential (primary) hypertension: Secondary | ICD-10-CM

## 2024-03-17 DIAGNOSIS — K5900 Constipation, unspecified: Secondary | ICD-10-CM | POA: Diagnosis not present

## 2024-03-17 DIAGNOSIS — Z8719 Personal history of other diseases of the digestive system: Secondary | ICD-10-CM

## 2024-03-17 DIAGNOSIS — F17210 Nicotine dependence, cigarettes, uncomplicated: Secondary | ICD-10-CM | POA: Diagnosis not present

## 2024-03-17 DIAGNOSIS — K5732 Diverticulitis of large intestine without perforation or abscess without bleeding: Secondary | ICD-10-CM | POA: Insufficient documentation

## 2024-03-17 NOTE — Progress Notes (Signed)
 Glenda Hill , M.D. Gastroenterology & Hepatology [facility] [address] [place], [place] [address] Primary Care Physician: Orlena Bitters, MD [address] [place] [address]  Chief Complaint:  Diverticulitis , with perforation  History of Present Illness: Glenda Hill is a 68 y.o. 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 Hershell Lose.  Never had a colonoscopy.  Patient is an active smoker.  Patient takes significant amount of NSAIDs for arthritis The patient denies having any nausea, vomiting, fever, chills, hematochezia, melena, hematemesis, abdominal distention, abdominal pain, diarrhea, jaundice, pruritus or weight loss.  Patient reports hernia near ostomy has gotten regular in recent years Patient reports she can take 4 flights of stairs without getting short of breath the only limitation is her knee pain  Last UJW:JXBJ Last Colonoscopy:none  FHx: neg for any gastrointestinal/liver disease, no malignancies Social: Active smoker Surgical: Diverting colostomy 2022  Past Medical History: Past Medical History:  Diagnosis Date   Complication of anesthesia    trouble waking up after C section   Ductal carcinoma in situ (DCIS) of left breast 2024   GERD (gastroesophageal reflux disease)    Hypertension     Past Surgical History: Past Surgical History:  Procedure Laterality Date   BACK SURGERY     x2 2014 and 2018   BREAST BIOPSY Left 06/03/2023   MM LT BREAST BX W LOC DEV 1ST LESION IMAGE BX SPEC STEREO GUIDE 06/03/2023 [facility]   BREAST BIOPSY  07/02/2023   MM LT RADIOACTIVE SEED LOC MAMMO GUIDE 07/02/2023 [facility]   BREAST LUMPECTOMY WITH  RADIOACTIVE SEED LOCALIZATION Left 07/04/2023   Procedure: LEFT BREAST LUMPECTOMY WITH RADIOACTIVE SEED LOCALIZATION;  Surgeon: Sim Dryer, MD;  Location: [facility] OR;  Service: General;  Laterality: Left;   CESAREAN SECTION     TONSILLECTOMY     removed as a child    Family History: Family History  Problem Relation Age of Onset   Heart disease Mother    Lung cancer Mother 75       smoking hx   Emphysema Mother        smoking hx   Ovarian cancer Sister        dx 82-50   Cervical cancer Paternal Grandmother        dx unknown age   Arthritis Other    Lung disease Other    Diabetes Other    Cancer Cousin 60       stomach and lung; pat female cousin    Social History: Social History   Tobacco Use  Smoking Status Every Day   Current packs/day: 1.00   Average packs/day: 1 pack/day for 51.3 years (51.3 ttl pk-yrs)   Types: Cigarettes   Start date: 1974  Smokeless Tobacco Never  Tobacco Comments   "stopped for a while"    Social History   Substance and Sexual Activity  Alcohol Use Not Currently   Social History   Substance and Sexual Activity  Drug Use No    Allergies: No Known Allergies  Medications: Current Outpatient Medications  Medication Sig Dispense Refill   amLODipine  (NORVASC ) 10 MG tablet Take 10 mg by mouth daily.     Calcium Carb-Cholecalciferol  600-20 MG-MCG TABS Take 600 mg by mouth  2 (two) times daily.     carvedilol (COREG) 12.5 MG tablet Take 12.5 mg by mouth 2 (two) times daily.     ibuprofen (ADVIL,MOTRIN) 200 MG tablet Take 400 mg by mouth every 6 (six) hours as needed for headache (pain).     losartan  (COZAAR ) 100 MG tablet Take 100 mg by mouth daily.     Multiple Vitamin (MULTIVITAMIN WITH MINERALS) TABS tablet Take 1 tablet by mouth daily. Woman     pantoprazole  (PROTONIX ) 40 MG tablet Take 40 mg by mouth daily.     tamoxifen  (NOLVADEX ) 10 MG tablet Take 1 tablet (10 mg total) by mouth daily. 90 tablet 3   TRELEGY ELLIPTA 100-62.5-25 MCG/ACT  AEPB Inhale 1 puff into the lungs daily.     Vitamin D , Ergocalciferol , (DRISDOL) 1.25 MG (50000 UNIT) CAPS capsule Take 50,000 Units by mouth once a week.     No current facility-administered medications for this visit.    Review of Systems: GENERAL: negative for malaise, night sweats HEENT: No changes in hearing or vision, no nose bleeds or other nasal problems. NECK: Negative for lumps, goiter, pain and significant neck swelling RESPIRATORY: Negative for cough, wheezing CARDIOVASCULAR: Negative for chest pain, leg swelling, palpitations, orthopnea GI: SEE HPI MUSCULOSKELETAL: Negative for joint pain or swelling, back pain, and muscle pain. SKIN: Negative for lesions, rash HEMATOLOGY Negative for prolonged bleeding, bruising easily, and swollen nodes. ENDOCRINE: Negative for cold or heat intolerance, polyuria, polydipsia and goiter. NEURO: negative for tremor, gait imbalance, syncope and seizures. The remainder of the review of systems is noncontributory.   Physical Exam: BP (!) 160/84   Pulse 67   Temp 98.1 F (36.7 C) (Oral)   Ht 5\' 9"  (1.753 m)   Wt 184 lb 3.2 oz (83.6 kg)   BMI 27.20 kg/m  GENERAL: The patient is AO x3, in no acute distress. HEENT: Head is normocephalic and atraumatic. EOMI are intact. Mouth is well hydrated and without lesions. NECK: Supple. No masses LUNGS: Clear to auscultation. No presence of rhonchi/wheezing/rales. Adequate chest expansion HEART: RRR, normal s1 and s2. ABDOMEN: Soft, nontender, no guarding, no peritoneal signs, and nondistended. BS +. No masses.   Imaging/Labs: as above     Latest Ref Rng & Units 06/27/2023    2:14 PM 05/17/2017    3:23 PM 12/25/2015    4:50 PM  CBC  WBC 4.0 - 10.5 K/uL 9.0  5.7  6.3   Hemoglobin 12.0 - 15.0 g/dL 16.1  09.6  04.5   Hematocrit 36.0 - 46.0 % 42.5  45.4  42.9   Platelets 150 - 400 K/uL 272  274  290    No results found for: "IRON", "TIBC", "FERRITIN"  I personally reviewed and interpreted the  available labs, imaging and endoscopic files.  Impression and Plan:  Glenda Hill is a 68 y.o. 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, diagnosis of breast cancer (05/2023) who presents for evaluation of colonoscopy for preoperative assessment for reversal of the ostomy  # History of diverticulitis  Patient never had a colonoscopy, has chronic underlying constipation, significant NSAID use and active smoker are all risk factors for diverticulitis  Patient was advised strongly smoking cessation and limiting amount of NSAID use Recommended high-fiber diet and preventing constipation  Patient appears to have METS>4 as she can walk 4 flights of stairs without getting short of breath the only limitation is her knee pain  Will plan  for colonoscopy via ostomy and also rectal stump to examine/evaluate and clear colon of any lesion prior to ostomy reversal  #HTN  The patient was found to have elevated blood pressure when vital signs were checked in the office. The blood pressure was rechecked by the nursing staff and it was found be persistently elevated >140/90 mmHg. I personally advised to the patient to follow up closely with PCP for hypertension control.   All questions were answered.      Kellsey Sansone Faizan Trayden Brandy, MD Gastroenterology and Hepatology [facility]   This chart has been completed using [facility] Dictation software, and while attempts have been made to ensure accuracy , certain words and phrases may not be transcribed as intended

## 2024-03-18 ENCOUNTER — Encounter (HOSPITAL_COMMUNITY): Payer: Self-pay

## 2024-03-18 ENCOUNTER — Ambulatory Visit (HOSPITAL_COMMUNITY)
Admission: RE | Admit: 2024-03-18 | Discharge: 2024-03-18 | Disposition: A | Discharge status: Home / Self Care | Source: Ambulatory Visit | Attending: Adult Health | Admitting: Adult Health

## 2024-03-18 DIAGNOSIS — R918 Other nonspecific abnormal finding of lung field: Secondary | ICD-10-CM | POA: Diagnosis present

## 2024-03-18 DIAGNOSIS — D0512 Intraductal carcinoma in situ of left breast: Secondary | ICD-10-CM | POA: Insufficient documentation

## 2024-03-19 ENCOUNTER — Encounter: Payer: Self-pay | Admitting: Internal Medicine

## 2024-03-20 ENCOUNTER — Telehealth (INDEPENDENT_AMBULATORY_CARE_PROVIDER_SITE_OTHER): Payer: Self-pay | Admitting: Gastroenterology

## 2024-03-20 NOTE — Telephone Encounter (Signed)
 Contacted Dr.Gross office to talk about scheduling TCS. Dr.Gross office is waiting on cardiac clearance and is not quite ready to schedule pt. Dr.Gross will reach out to our office when ready. Left message on pt voicemail letting her know what is going on.

## 2024-03-24 ENCOUNTER — Telehealth: Payer: Self-pay | Admitting: *Deleted

## 2024-03-24 ENCOUNTER — Other Ambulatory Visit: Payer: Self-pay | Admitting: *Deleted

## 2024-03-24 DIAGNOSIS — F17210 Nicotine dependence, cigarettes, uncomplicated: Secondary | ICD-10-CM

## 2024-03-24 DIAGNOSIS — D0512 Intraductal carcinoma in situ of left breast: Secondary | ICD-10-CM

## 2024-03-24 DIAGNOSIS — R918 Other nonspecific abnormal finding of lung field: Secondary | ICD-10-CM

## 2024-03-24 DIAGNOSIS — F17200 Nicotine dependence, unspecified, uncomplicated: Secondary | ICD-10-CM

## 2024-03-24 NOTE — Telephone Encounter (Signed)
-----   Message from Conway Dennis sent at 03/24/2024 12:34 PM EDT ----- Please let patient know that her CT shows no cancer.  She has lung nodules that are stable and radiation changes present.  I recommend repeat in 6 months.  If agreeable, will you please order CT chest wo contrast with expected date for 09/19/2024? ----- Message ----- From: Interface, Rad Results In Sent: 03/23/2024   3:47 AM EDT To: Percival Brace, NP

## 2024-03-24 NOTE — Telephone Encounter (Signed)
 Per Alwin Baars, NP, called pt with message below. Pt agreed to CT scan and verbalized understanding. Orders for CT have been placed

## 2024-04-06 ENCOUNTER — Ambulatory Visit: Attending: Internal Medicine | Admitting: Internal Medicine

## 2024-04-06 ENCOUNTER — Encounter: Payer: Self-pay | Admitting: Internal Medicine

## 2024-04-06 ENCOUNTER — Telehealth: Payer: Self-pay | Admitting: Internal Medicine

## 2024-04-06 VITALS — BP 132/78 | HR 65 | Ht 67.0 in | Wt 194.6 lb

## 2024-04-06 DIAGNOSIS — Z01810 Encounter for preprocedural cardiovascular examination: Secondary | ICD-10-CM | POA: Diagnosis not present

## 2024-04-06 DIAGNOSIS — Z136 Encounter for screening for cardiovascular disorders: Secondary | ICD-10-CM

## 2024-04-06 NOTE — Patient Instructions (Signed)
 Medication Instructions:  Your physician recommends that you continue on your current medications as directed. Please refer to the Current Medication list given to you today.   Labwork: None  Testing/Procedures: Your physician has requested that you have a lexiscan myoview. For further information please visit [email or web address] Please follow instruction sheet, as given.   Follow-Up: Your physician recommends that you schedule a follow-up appointment in: Pending Results  Any Other Special Instructions Will Be Listed Below (If Applicable). Thank you for choosing [facility]!     If you need a refill on your cardiac medications before your next appointment, please call your pharmacy.

## 2024-04-06 NOTE — Progress Notes (Signed)
 Cardiology Office Note  Date: 04/06/2024   ID: Glenda Hill, DOB 10-31-1956, MRN [id number]  PCP:  Orlena Bitters, MD  Cardiologist:  None Electrophysiologist:  None   History of Present Illness: Glenda Hill is a 68 y.o. female known to have history of sigmoid perforation s/p diverting colostomy in 2022, breast cancer in 2024 was referred to cardiology clinic for preop cardiac risk stratification for rectosigmoid resection, colostomy reversal and repair of the parastomal hernia.  Ambulation limited primarily by knee pain and plantar fasciitis.  METs less than 4.  Otherwise asymptomatic, no angina, DOE, dizziness,, presyncope, syncope unexplained.  She recently underwent surgery for her breast cancer.  Past Medical History:  Diagnosis Date   Complication of anesthesia    trouble waking up after C section   Ductal carcinoma in situ (DCIS) of left breast 2024   GERD (gastroesophageal reflux disease)    Hypertension     Past Surgical History:  Procedure Laterality Date   BACK SURGERY     x2 2014 and 2018   BREAST BIOPSY Left 06/03/2023   MM LT BREAST BX W LOC DEV 1ST LESION IMAGE BX SPEC STEREO GUIDE 06/03/2023 [facility]   BREAST BIOPSY  07/02/2023   MM LT RADIOACTIVE SEED LOC MAMMO GUIDE 07/02/2023 [facility]   BREAST LUMPECTOMY WITH RADIOACTIVE SEED LOCALIZATION Left 07/04/2023   Procedure: LEFT BREAST LUMPECTOMY WITH RADIOACTIVE SEED LOCALIZATION;  Surgeon: Sim Dryer, MD;  Location: [facility] OR;  Service: General;  Laterality: Left;   CESAREAN SECTION     TONSILLECTOMY     removed as a child    Current Outpatient Medications  Medication Sig Dispense Refill   amLODipine  (NORVASC ) 10 MG tablet Take 10 mg by mouth daily.     Calcium Carb-Cholecalciferol  600-20 MG-MCG TABS Take 600 mg by mouth 2 (two) times daily.     carvedilol (COREG) 12.5 MG tablet Take 12.5 mg by mouth 2 (two) times daily.     ibuprofen (ADVIL,MOTRIN) 200 MG tablet Take 400 mg by  mouth every 6 (six) hours as needed for headache (pain).     levocetirizine (XYZAL) 5 MG tablet Take 5 mg by mouth daily.     losartan  (COZAAR ) 100 MG tablet Take 100 mg by mouth daily.     Multiple Vitamin (MULTIVITAMIN WITH MINERALS) TABS tablet Take 1 tablet by mouth daily. Woman     pantoprazole  (PROTONIX ) 40 MG tablet Take 40 mg by mouth daily.     tamoxifen  (NOLVADEX ) 10 MG tablet Take 1 tablet (10 mg total) by mouth daily. 90 tablet 3   TRELEGY ELLIPTA 100-62.5-25 MCG/ACT AEPB Inhale 1 puff into the lungs daily.     Vitamin D , Ergocalciferol , (DRISDOL) 1.25 MG (50000 UNIT) CAPS capsule Take 50,000 Units by mouth once a week.     No current facility-administered medications for this visit.   Allergies:  Patient has no known allergies.   Social History: The patient  reports that she has been smoking cigarettes. She started smoking about 51 years ago. She has a 51.4 pack-year smoking history. She has never used smokeless tobacco. She reports that she does not currently use alcohol. She reports that she does not use drugs.   Family History: The patient's family history includes Arthritis in an other family member; Cancer (age of onset: 80) in her cousin; Cervical cancer in her paternal grandmother; Diabetes in an other family member; Emphysema in her mother; Heart disease in her mother; Lung cancer (age of  onset: 7) in her mother; Lung disease in an other family member; Ovarian cancer in her sister.   ROS:  Please see the history of present illness. Otherwise, complete review of systems is positive for none  All other systems are reviewed and negative.   Physical Exam: VS:  BP 132/78   Pulse 65   Ht 5\' 7"  (1.702 m)   Wt 194 lb 9.6 oz (88.3 kg)   SpO2 95%   BMI 30.48 kg/m , BMI Body mass index is 30.48 kg/m.  Wt Readings from Last 3 Encounters:  04/06/24 194 lb 9.6 oz (88.3 kg)  03/17/24 184 lb 3.2 oz (83.6 kg)  12/23/23 181 lb 11.2 oz (82.4 kg)    General: Patient appears  comfortable at rest. HEENT: Conjunctiva and lids normal, oropharynx clear with moist mucosa. Neck: Supple, no elevated JVP or carotid bruits, no thyromegaly. Lungs: Clear to auscultation, nonlabored breathing at rest. Cardiac: Regular rate and rhythm, no S3 or significant systolic murmur, no pericardial rub. Abdomen: Soft, nontender, no hepatomegaly, bowel sounds present, no guarding or rebound. Extremities: No pitting edema, distal pulses 2+. Skin: Warm and dry. Musculoskeletal: No kyphosis. Neuropsychiatric: Alert and oriented x3, affect grossly appropriate.  Recent Labwork: 06/27/2023: BUN 16; Creatinine, Ser 0.97; Hemoglobin 14.3; Platelets 272; Potassium 3.5; Sodium 137    Assessment and Plan:  Preop cardiac risk stratification for rectosigmoid resection, colostomy reversal, repair of parastomal hernia: She will be undergoing high risk surgery, METs less than 4 (ambulation admitted by knee pain and plantar fasciitis).  Will obtain Lexiscan .  No loud murmur, no need of echocardiogram.  Preop recommendations pending stress test.  If stress test shows no evidence of ischemia, she is low risk for any perioperative cardiac complications.  HTN, controlled: Continue current antihypertensives, amlodipine  10 mg once daily, carvedilol 12.5 mg twice daily and losartan  100 mg once daily.   Reviewed specialist notes.  Medication Adjustments/Labs and Tests Ordered: Current medicines are reviewed at length with the patient today.  Concerns regarding medicines are outlined above.    Disposition:  Follow up pending results  Signed Danniela Mcbrearty Priya Tyrus Wilms, MD, 04/06/2024 1:22 PM    [facility] at [facility] [address] [place], [place], [place] [address]

## 2024-04-06 NOTE — Telephone Encounter (Signed)
 Checking percert on the following patient for testing scheduled at [facility].    LEXISCAN   04/10/2024

## 2024-04-10 ENCOUNTER — Ambulatory Visit (HOSPITAL_COMMUNITY)

## 2024-04-10 ENCOUNTER — Encounter (HOSPITAL_COMMUNITY)

## 2024-04-16 ENCOUNTER — Telehealth (HOSPITAL_COMMUNITY): Payer: Self-pay

## 2024-04-17 ENCOUNTER — Ambulatory Visit (HOSPITAL_BASED_OUTPATIENT_CLINIC_OR_DEPARTMENT_OTHER)
Admission: RE | Admit: 2024-04-17 | Discharge: 2024-04-17 | Disposition: A | Discharge status: Home / Self Care | Source: Ambulatory Visit | Attending: Internal Medicine

## 2024-04-17 ENCOUNTER — Ambulatory Visit (HOSPITAL_COMMUNITY)
Admission: RE | Admit: 2024-04-17 | Discharge: 2024-04-17 | Disposition: A | Discharge status: Home / Self Care | Source: Ambulatory Visit | Attending: Internal Medicine | Admitting: Internal Medicine

## 2024-04-17 ENCOUNTER — Encounter (HOSPITAL_COMMUNITY): Payer: Self-pay

## 2024-04-17 DIAGNOSIS — Z01810 Encounter for preprocedural cardiovascular examination: Secondary | ICD-10-CM | POA: Insufficient documentation

## 2024-04-17 DIAGNOSIS — Z136 Encounter for screening for cardiovascular disorders: Secondary | ICD-10-CM

## 2024-04-17 LAB — NM MYOCAR MULTI W/SPECT W/WALL MOTION / EF
Estimated workload: 1
LV dias vol: 87 mL (ref 46–106)
LV sys vol: 28 mL
MPHR: 154 {beats}/min
Nuc Stress EF: 68 %
Peak HR: 93 {beats}/min
Percent HR: 60 %
RATE: 0.4
Rest HR: 60 {beats}/min
Rest Nuclear Isotope Dose: 10.8 mCi
SDS: 0
SRS: 2
SSS: 2
ST Depression (mm): 0 mm
Stress Nuclear Isotope Dose: 33 mCi
TID: 1.27

## 2024-04-17 MED ORDER — TECHNETIUM TC 99M TETROFOSMIN IV KIT
10.0000 | PACK | Freq: Once | INTRAVENOUS | Status: AC | PRN
  Administered 2024-04-17: 10.8 via INTRAVENOUS

## 2024-04-17 MED ORDER — REGADENOSON 0.4 MG/5ML IV SOLN
INTRAVENOUS | Status: AC
  Filled 2024-04-17: qty 5

## 2024-04-17 MED ORDER — TECHNETIUM TC 99M TETROFOSMIN IV KIT
30.0000 | PACK | Freq: Once | INTRAVENOUS | Status: AC | PRN
  Administered 2024-04-17: 33 via INTRAVENOUS

## 2024-04-17 MED ORDER — SODIUM CHLORIDE FLUSH 0.9 % IV SOLN
INTRAVENOUS | Status: AC
  Filled 2024-04-17: qty 10

## 2024-04-23 ENCOUNTER — Ambulatory Visit: Payer: Self-pay | Admitting: Internal Medicine

## 2024-04-27 ENCOUNTER — Ambulatory Visit: Payer: Self-pay | Admitting: Surgery

## 2024-04-27 DIAGNOSIS — R739 Hyperglycemia, unspecified: Secondary | ICD-10-CM

## 2024-05-01 ENCOUNTER — Other Ambulatory Visit: Payer: Self-pay | Admitting: Urology

## 2024-05-06 NOTE — Telephone Encounter (Signed)
 Hi Mindy   I saw the patient in clinic on 02/2024 and advised colonoscopy via ostomy and also rectal stump to examine/evaluate and clear colon of any lesion prior to ostomy reversal   I do see she is scheduled for surgery on 7/16 and believe that Dr. Hershell Lose office must have called for colonoscopy to be done anytime before 7/16 not  necessarily exactly on 7/15 .  If we can schedule her with me anytime before 7/16 would be great , if not than I have no problem getting patient scheduled with any other GI MD here for colonoscopy so that there is no delay in the surgery with Dr Hershell Lose  Dr Grayland Le

## 2024-05-06 NOTE — Telephone Encounter (Signed)
 Dr. Hershell Lose office called. Patient scheduled for surgery 7/16 with them and he is wanting patient to have colonoscopy with us  on 7/15. I advised Dr. Alita Irwin is actually seeing patients in the office that day. Requesting another provider do procedure. Since patient saw Dr. Alita Irwin in office will need approval from him and the the other provider. Looking at schedule, looks like Dr. Sammi Crick and Dr. Mordechai April are the 2 providers in the hospital on 7/15. Please advise thanks!

## 2024-05-07 NOTE — Telephone Encounter (Signed)
 Called pt offered dates Dr. Alita Irwin has available before 7/16. Advised 7/15 Dr. Alita Irwin was seeing patients in office. She will call back to schedule once she speaks with Dr. Hershell Lose office

## 2024-05-07 NOTE — Telephone Encounter (Signed)
LMOVM to call back to schedule 

## 2024-05-11 MED ORDER — PEG 3350-KCL-NA BICARB-NACL 420 G PO SOLR: 4000.0000 mL | Freq: Once | ORAL | 0 refills | Status: AC

## 2024-05-11 NOTE — Addendum Note (Signed)
 Addended by: JEANELL GRAEME RAMAN on: 05/11/2024 11:08 AM   Modules accepted: Orders

## 2024-05-11 NOTE — Telephone Encounter (Signed)
 Pt called back. She stated she has been trying to get in touch with Dr. Sheldon office but have not heard back from them. We scheduled her procedure for 7/10. She will let them know. Instructions to be sent to patient. Rx for prep also sent into pharmacy

## 2024-05-20 NOTE — Patient Instructions (Signed)
 SURGICAL WAITING ROOM VISITATION  Patients having surgery or a procedure may have no more than 2 support people in the waiting area - these visitors may rotate.    Children under the age of 8 must have an adult with them who is not the patient.  Visitors with respiratory illnesses are discouraged from visiting and should remain at home.  If the patient needs to stay at the hospital during part of their recovery, the visitor guidelines for inpatient rooms apply. Pre-op nurse will coordinate an appropriate time for 1 support person to accompany patient in pre-op.  This support person may not rotate.    Please refer to the [facility] website for the visitor guidelines for Inpatients (after your surgery is over and you are in a regular room).       Your procedure is scheduled on: 06/03/24   Report to [facility] Main Entrance    Report to admitting at : 6;15 AM   Call this number if you have problems the morning of surgery [phone]   Clear liquids starting the day before surgery until : 5:30 AM DAY OF SURGERY. Drink plenty fluids the day of the prep.  Water Non-Citrus Juices (without pulp, NO RED-Apple, White grape, White cranberry) Black Coffee (NO MILK/CREAM OR CREAMERS, sugar ok)  Clear Tea (NO MILK/CREAM OR CREAMERS, sugar ok) regular and decaf                             Plain Jell-O (NO RED)                                           Fruit ices (not with fruit pulp, NO RED)                                     Popsicles (NO RED)                                                               Sports drinks like Gatorade (NO RED)              Drink 2 Ensure/G2 drinks AT 10:00 PM the night before surgery.       The day of surgery:  Drink ONE (1) Pre-Surgery Clear Ensure at : 5:30 AM the morning of surgery. Drink in one sitting. Do not sip.  This drink was given to you during your hospital  pre-op appointment visit. Nothing else to drink after completing the   Pre-Surgery Clear Ensure or G2.          If you have questions, please contact your surgeon's office.  FOLLOW BOWEL PREP AND ANY ADDITIONAL PRE OP INSTRUCTIONS YOU RECEIVED FROM YOUR SURGEON'S OFFICE!!!   Oral Hygiene is also important to reduce your risk of infection.                                    Remember - BRUSH YOUR TEETH THE MORNING OF SURGERY WITH  YOUR REGULAR TOOTHPASTE  DENTURES WILL BE REMOVED PRIOR TO SURGERY PLEASE DO NOT APPLY Poly grip OR ADHESIVES!!!   Do NOT smoke after Midnight   Stop all vitamins and herbal supplements 7 days before surgery.   Take these medicines the morning of surgery with A SIP OF WATER: amlodipine ,carvedilol,pantoprazole ,tanoxifen,levocetirizine.Use inhalers as usual.                              You may not have any metal on your body including hair pins, jewelry, and body piercing             Do not wear make-up, lotions, powders, perfumes/cologne, or deodorant  Do not wear nail polish including gel and S&S, artificial/acrylic nails, or any other type of covering on natural nails including finger and toenails. If you have artificial nails, gel coating, etc. that needs to be removed by a nail salon please have this removed prior to surgery or surgery may need to be canceled/ delayed if the surgeon/ anesthesia feels like they are unable to be safely monitored.   Do not shave  48 hours prior to surgery.    Do not bring valuables to the hospital. [facility] IS NOT             RESPONSIBLE   FOR VALUABLES.   Contacts, glasses, dentures or bridgework may not be worn into surgery.   Bring small overnight bag day of surgery.   DO NOT BRING YOUR HOME MEDICATIONS TO THE HOSPITAL. PHARMACY WILL DISPENSE MEDICATIONS LISTED ON YOUR MEDICATION LIST TO YOU DURING YOUR ADMISSION IN THE HOSPITAL!    Patients discharged on the day of surgery will not be allowed to drive home.  Someone NEEDS to stay with you for the first 24 hours after  anesthesia.   Special Instructions: Bring a copy of your healthcare power of attorney and living will documents the day of surgery if you haven't scanned them before.              Please read over the following fact sheets you were given: IF YOU HAVE QUESTIONS ABOUT YOUR PRE-OP INSTRUCTIONS PLEASE CALL [phone]   If you received a COVID test during your pre-op visit  it is requested that you wear a mask when out in public, stay away from anyone that may not be feeling well and notify your surgeon if you develop symptoms. If you test positive for Covid or have been in contact with anyone that has tested positive in the last 10 days please notify you surgeon.    [place] - Preparing for Surgery Before surgery, you can play an important role.  Because skin is not sterile, your skin needs to be as free of germs as possible.  You can reduce the number of germs on your skin by washing with CHG (chlorahexidine gluconate) soap before surgery.  CHG is an antiseptic cleaner which kills germs and bonds with the skin to continue killing germs even after washing. Please DO NOT use if you have an allergy to CHG or antibacterial soaps.  If your skin becomes reddened/irritated stop using the CHG and inform your nurse when you arrive at Short Stay. Do not shave (including legs and underarms) for at least 48 hours prior to the first CHG shower.  You may shave your face/neck. Please follow these instructions carefully:  1.  Shower with CHG Soap the night before surgery and the  morning of  Surgery.  2.  If you choose to wash your hair, wash your hair first as usual with your  normal  shampoo.  3.  After you shampoo, rinse your hair and body thoroughly to remove the  shampoo.                           4.  Use CHG as you would any other liquid soap.  You can apply chg directly  to the skin and wash                       Gently with a scrungie or clean washcloth.  5.  Apply the CHG Soap to your body ONLY FROM THE  NECK DOWN.   Do not use on face/ open                           Wound or open sores. Avoid contact with eyes, ears mouth and genitals (private parts).                       Wash face,  Genitals (private parts) with your normal soap.             6.  Wash thoroughly, paying special attention to the area where your surgery  will be performed.  7.  Thoroughly rinse your body with warm water from the neck down.  8.  DO NOT shower/wash with your normal soap after using and rinsing off  the CHG Soap.                9.  Pat yourself dry with a clean towel.            10.  Wear clean pajamas.            11.  Place clean sheets on your bed the night of your first shower and do not  sleep with pets. Day of Surgery : Do not apply any lotions/deodorants the morning of surgery.  Please wear clean clothes to the hospital/surgery center.  FAILURE TO FOLLOW THESE INSTRUCTIONS MAY RESULT IN THE CANCELLATION OF YOUR SURGERY PATIENT SIGNATURE_________________________________  NURSE SIGNATURE__________________________________  ________________________________________________________________________ WHAT IS A BLOOD TRANSFUSION? Blood Transfusion Information  A transfusion is the replacement of blood or some of its parts. Blood is made up of multiple cells which provide different functions. Red blood cells carry oxygen and are used for blood loss replacement. White blood cells fight against infection. Platelets control bleeding. Plasma helps clot blood. Other blood products are available for specialized needs, such as hemophilia or other clotting disorders. BEFORE THE TRANSFUSION  Who gives blood for transfusions?  Healthy volunteers who are fully evaluated to make sure their blood is safe. This is blood bank blood. Transfusion therapy is the safest it has ever been in the practice of medicine. Before blood is taken from a donor, a complete history is taken to make sure that person has no history of diseases  nor engages in risky social behavior (examples are intravenous drug use or sexual activity with multiple partners). The donor's travel history is screened to minimize risk of transmitting infections, such as malaria. The donated blood is tested for signs of infectious diseases, such as HIV and hepatitis. The blood is then tested to be sure it is compatible with you in order to minimize the chance of a transfusion reaction. If you  or a relative donates blood, this is often done in anticipation of surgery and is not appropriate for emergency situations. It takes many days to process the donated blood. RISKS AND COMPLICATIONS Although transfusion therapy is very safe and saves many lives, the main dangers of transfusion include:  Getting an infectious disease. Developing a transfusion reaction. This is an allergic reaction to something in the blood you were given. Every precaution is taken to prevent this. The decision to have a blood transfusion has been considered carefully by your caregiver before blood is given. Blood is not given unless the benefits outweigh the risks. AFTER THE TRANSFUSION Right after receiving a blood transfusion, you will usually feel much better and more energetic. This is especially true if your red blood cells have gotten low (anemic). The transfusion raises the level of the red blood cells which carry oxygen, and this usually causes an energy increase. The nurse administering the transfusion will monitor you carefully for complications. HOME CARE INSTRUCTIONS  No special instructions are needed after a transfusion. You may find your energy is better. Speak with your caregiver about any limitations on activity for underlying diseases you may have. SEEK MEDICAL CARE IF:  Your condition is not improving after your transfusion. You develop redness or irritation at the intravenous (IV) site. SEEK IMMEDIATE MEDICAL CARE IF:  Any of the following symptoms occur over the next 12  hours: Shaking chills. You have a temperature by mouth above 102 F (38.9 C), not controlled by medicine. Chest, back, or muscle pain. People around you feel you are not acting correctly or are confused. Shortness of breath or difficulty breathing. Dizziness and fainting. You get a rash or develop hives. You have a decrease in urine output. Your urine turns a dark color or changes to pink, red, or brown. Any of the following symptoms occur over the next 10 days: You have a temperature by mouth above 102 F (38.9 C), not controlled by medicine. Shortness of breath. Weakness after normal activity. The white part of the eye turns yellow (jaundice). You have a decrease in the amount of urine or are urinating less often. Your urine turns a dark color or changes to pink, red, or brown. Document Released: 11/02/2000 Document Revised: 01/28/2012 Document Reviewed: 06/21/2008 [facility] Patient Information 2014 [place], [place].  _______________________________________________________________________

## 2024-05-21 ENCOUNTER — Encounter (HOSPITAL_COMMUNITY)
Admission: RE | Admit: 2024-05-21 | Discharge: 2024-05-21 | Disposition: A | Discharge status: Home / Self Care | Source: Ambulatory Visit | Attending: Surgery | Admitting: Surgery

## 2024-05-21 ENCOUNTER — Other Ambulatory Visit: Payer: Self-pay

## 2024-05-21 ENCOUNTER — Encounter (HOSPITAL_COMMUNITY): Payer: Self-pay

## 2024-05-21 VITALS — BP 128/77 | HR 65 | Temp 98.3°F | Ht 67.0 in | Wt 186.0 lb

## 2024-05-21 DIAGNOSIS — Z853 Personal history of malignant neoplasm of breast: Secondary | ICD-10-CM | POA: Insufficient documentation

## 2024-05-21 DIAGNOSIS — Z933 Colostomy status: Secondary | ICD-10-CM | POA: Insufficient documentation

## 2024-05-21 DIAGNOSIS — I700 Atherosclerosis of aorta: Secondary | ICD-10-CM | POA: Diagnosis not present

## 2024-05-21 DIAGNOSIS — K631 Perforation of intestine (nontraumatic): Secondary | ICD-10-CM | POA: Diagnosis not present

## 2024-05-21 DIAGNOSIS — J449 Chronic obstructive pulmonary disease, unspecified: Secondary | ICD-10-CM | POA: Insufficient documentation

## 2024-05-21 DIAGNOSIS — R739 Hyperglycemia, unspecified: Secondary | ICD-10-CM | POA: Diagnosis not present

## 2024-05-21 DIAGNOSIS — Z923 Personal history of irradiation: Secondary | ICD-10-CM | POA: Insufficient documentation

## 2024-05-21 DIAGNOSIS — Z01812 Encounter for preprocedural laboratory examination: Secondary | ICD-10-CM | POA: Insufficient documentation

## 2024-05-21 DIAGNOSIS — Z87891 Personal history of nicotine dependence: Secondary | ICD-10-CM | POA: Insufficient documentation

## 2024-05-21 DIAGNOSIS — I10 Essential (primary) hypertension: Secondary | ICD-10-CM | POA: Diagnosis not present

## 2024-05-21 LAB — TYPE AND SCREEN
ABO/RH(D): A POS
Antibody Screen: NEGATIVE

## 2024-05-21 LAB — COMPREHENSIVE METABOLIC PANEL WITH GFR
ALT: 17 U/L (ref 0–44)
AST: 23 U/L (ref 15–41)
Albumin: 3.8 g/dL (ref 3.5–5.0)
Alkaline Phosphatase: 91 U/L (ref 38–126)
Anion gap: 10 (ref 5–15)
BUN: 9 mg/dL (ref 8–23)
CO2: 27 mmol/L (ref 22–32)
Calcium: 8.7 mg/dL — ABNORMAL LOW (ref 8.9–10.3)
Chloride: 104 mmol/L (ref 98–111)
Creatinine, Ser: 0.88 mg/dL (ref 0.44–1.00)
GFR, Estimated: >60 mL/min (ref >60)
Glucose, Bld: 118 mg/dL — ABNORMAL HIGH (ref 70–99)
Potassium: 3.4 mmol/L — ABNORMAL LOW (ref 3.5–5.1)
Sodium: 141 mmol/L (ref 135–145)
Total Bilirubin: 0.6 mg/dL (ref 0.0–1.2)
Total Protein: 6.8 g/dL (ref 6.5–8.1)

## 2024-05-21 LAB — CBC
HCT: 43 % (ref 36.0–46.0)
Hemoglobin: 14.2 g/dL (ref 12.0–15.0)
MCH: 32 pg (ref 26.0–34.0)
MCHC: 33 g/dL (ref 30.0–36.0)
MCV: 96.8 fL (ref 80.0–100.0)
Platelets: 294 10*3/uL (ref 150–400)
RBC: 4.44 MIL/uL (ref 3.87–5.11)
RDW: 12.9 % (ref 11.5–15.5)
WBC: 8.5 10*3/uL (ref 4.0–10.5)
nRBC: 0 % (ref 0.0–0.2)

## 2024-05-21 LAB — HEMOGLOBIN A1C
Hgb A1c MFr Bld: 5 % (ref 4.8–5.6)
Mean Plasma Glucose: 96.8 mg/dL

## 2024-05-21 NOTE — Progress Notes (Addendum)
 For Anesthesia: PCP - Rosamond Leta NOVAK, MD  Cardiologist - Stacia Diannah SQUIBB, MD LOV: 04/06/24 Clearance: pending. Bowel Prep reminder:  Chest x-ray - CT Chest: 03/23/24. Lexicon: 04/17/24 EKG - 04/06/24 Stress Test -  ECHO -  Cardiac Cath -  Pacemaker/ICD device last checked: Pacemaker orders received: Device Rep notified:  Spinal Cord Stimulator:  Sleep Study - N/A CPAP -   Fasting Blood Sugar -  Checks Blood Sugar _____ times a day Date and result of last Hgb A1c-  Last dose of GLP1 agonist- N/A GLP1 instructions:   Last dose of SGLT-2 inhibitors- N/AA SGLT-2 instructions:   Blood Thinner Instructions:N/A Aspirin Instructions: Last Dose:  Activity level: Can go up a flight of stairs and activities of daily living without stopping and without chest pain and/or shortness of breath   Able to exercise without chest pain and/or shortness of breath  Anesthesia review: Hx: HTN,Smoker  Patient denies shortness of breath, fever, cough and chest pain at PAT appointment   Patient verbalized understanding of instructions that were reviewed over the telephone.

## 2024-05-25 ENCOUNTER — Encounter (HOSPITAL_COMMUNITY): Payer: Self-pay

## 2024-05-25 NOTE — Progress Notes (Signed)
 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 PMH of former smoking, aortic atherosclerosis, COPD (by CT), HTN, history of breast cancer s/p lumpectomy with XRT (06/2023), GERD, history of sigmoid perforation s/p diverting colostomy in 2022.  Prior anesthesia complications includes prolonged emergence after C-section  Patient seen by cardiology on 04/06/2024 for preop evaluation.  Stress test was recommended due to not being able to complete 4 METS.  Stress test completed on 04/17/2024 and was low to intermediate risk.  Per Dr. Maddireddy patient cleared for surgery: Normal stress test. TID normal for Lexiscan  (would be abnormal for exercise myoview ).  Low risk for any perioperative cardiac complications.  VS: BP 128/77   Pulse 65   Temp 36.8 C (Oral)   Ht 5' 7 (1.702 m)   Wt 84.4 kg   SpO2 97%   BMI 29.13 kg/m   PROVIDERS: Rosamond Leta NOVAK, MD   LABS: Labs reviewed: Acceptable for surgery. (all labs ordered are listed, but only abnormal results are displayed)  Labs Reviewed  COMPREHENSIVE METABOLIC PANEL WITH GFR - Abnormal; Notable for the following components:      Result Value   Potassium 3.4 (*)    Glucose, Bld 118 (*)     Calcium 8.7 (*)    All other components within normal limits  CBC  HEMOGLOBIN A1C  TYPE AND SCREEN     IMAGES: CT chest 03/18/24:  IMPRESSION: 1. Interval increased subpleural interstitial and pleural thickening and bronchiolectasis anterior left upper lobe, almost certainly related to XRT. 2. There are 2 new 3 mm and 4 mm left upper lobe nodules, both surrounded by ground-glass disease and both medial to the XRT-related pleuroparenchymal changes. These are probably nodular scars or small airway impactions associated with the XRT. Attention on follow-up recommended. 3. Stable 3 mm left upper lobe apical and 3 mm left lower lobe nodules. 4. Emphysema and diffuse bronchitis. 5. Aortic atherosclerosis. 6. Mild hepatic steatosis, improved. 7. 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 8. Osteopenia and degenerative change.   Aortic Atherosclerosis (ICD10-I70.0) and Emphysema (ICD10-J43.9).   EKG 04/06/2024  Normal sinus rhythm, rate 65  CV:  Stress test 04/17/2024:  Narrative & Impression     Lexiscan  stress EKGS shows no change from baseline EKG   Myoview  with normal perfusion but TID noted (1.27; upper normal 1.25) Cannot completely  exclude balanced ischemia   LVEF normal at 68 %   Low to intermediate risk study( based on TID) Past Medical History:  Diagnosis Date   Complication of anesthesia    trouble waking up after C section   Ductal carcinoma in situ (DCIS) of left breast 2024   GERD (  gastroesophageal reflux disease)    Hypertension     Past Surgical History:  Procedure Laterality Date   BACK SURGERY     x2 2014 and 2018   BREAST BIOPSY Left 06/03/2023   MM LT BREAST BX W LOC DEV 1ST LESION IMAGE BX SPEC STEREO GUIDE 06/03/2023 [facility]   BREAST BIOPSY  07/02/2023   MM LT RADIOACTIVE SEED LOC MAMMO GUIDE 07/02/2023 [facility]   BREAST LUMPECTOMY WITH RADIOACTIVE SEED LOCALIZATION Left 07/04/2023   Procedure: LEFT BREAST  LUMPECTOMY WITH RADIOACTIVE SEED LOCALIZATION;  Surgeon: Vanderbilt Ned, MD;  Location: [facility];  Service: General;  Laterality: Left;   CESAREAN SECTION     TONSILLECTOMY     removed as a child    MEDICATIONS:  amLODipine  (NORVASC ) 10 MG tablet   Calcium Carb-Cholecalciferol  600-20 MG-MCG TABS   carvedilol (COREG) 12.5 MG tablet   ibuprofen (ADVIL,MOTRIN) 200 MG tablet   levocetirizine (XYZAL) 5 MG tablet   losartan  (COZAAR ) 100 MG tablet   meloxicam (MOBIC) 7.5 MG tablet   Multiple Vitamin (MULTIVITAMIN WITH MINERALS) TABS tablet   pantoprazole  (PROTONIX ) 40 MG tablet   tamoxifen  (NOLVADEX ) 10 MG tablet   TRELEGY ELLIPTA 100-62.5-25 MCG/ACT AEPB   Vitamin D , Ergocalciferol , (DRISDOL) 1.25 MG (50000 UNIT) CAPS capsule   No current facility-administered medications for this encounter.   Burnard CHRISTELLA Odis DEVONNA [facility] Short Stay/Anesthesiology [facility] Phone [phone] 05/25/2024 2:49 PM

## 2024-05-25 NOTE — Anesthesia Preprocedure Evaluation (Addendum)
 Anesthesia Evaluation  Patient identified by MRN, date of birth, ID band Patient awake    Reviewed: Allergy & Precautions, H&P , NPO status , Patient's Chart, lab work & pertinent test results, reviewed documented beta blocker date and time   History of Anesthesia Complications (+) history of anesthetic complications  Airway Mallampati: II  TM Distance: >3 FB Neck ROM: full    Dental no notable dental hx.    Pulmonary COPD, Current Smoker and Patient abstained from smoking.   Pulmonary exam normal breath sounds clear to auscultation       Cardiovascular Exercise Tolerance: Good hypertension, Pt. on medications  Rhythm:regular Rate:Normal     Neuro/Psych   Anxiety     negative neurological ROS  negative psych ROS   GI/Hepatic Neg liver ROS,GERD  ,,  Endo/Other  negative endocrine ROS    Renal/GU negative Renal ROS  negative genitourinary   Musculoskeletal   Abdominal   Peds  Hematology negative hematology ROS (+)   Anesthesia Other Findings   Reproductive/Obstetrics negative OB ROS                              Anesthesia Physical Anesthesia Plan  ASA: 3  Anesthesia Plan: General   Post-op Pain Management: Tylenol  PO (pre-op)* and Gabapentin  PO (pre-op)*   Induction: Intravenous  PONV Risk Score and Plan: 3 and Ondansetron , Dexamethasone , Midazolam  and Treatment may vary due to age or medical condition  Airway Management Planned: Oral ETT  Additional Equipment:   Intra-op Plan:   Post-operative Plan:   Informed Consent: I have reviewed the patients History and Physical, chart, labs and discussed the procedure including the risks, benefits and alternatives for the proposed anesthesia with the patient or authorized representative who has indicated his/her understanding and acceptance.     Dental Advisory Given  Plan Discussed with: CRNA  Anesthesia Plan Comments: (See  PAT note from 7/3)         Anesthesia Quick Evaluation

## 2024-05-26 ENCOUNTER — Encounter (HOSPITAL_COMMUNITY)
Admission: RE | Admit: 2024-05-26 | Discharge: 2024-05-26 | Disposition: A | Discharge status: Home / Self Care | Source: Ambulatory Visit | Attending: Gastroenterology | Admitting: Gastroenterology

## 2024-05-26 ENCOUNTER — Encounter (HOSPITAL_COMMUNITY): Payer: Self-pay

## 2024-05-26 HISTORY — DX: Chronic obstructive pulmonary disease, unspecified: J44.9

## 2024-05-26 HISTORY — DX: Colostomy status: Z93.3

## 2024-05-26 HISTORY — DX: Diverticulitis of intestine, part unspecified, without perforation or abscess without bleeding: K57.92

## 2024-05-26 NOTE — Patient Instructions (Signed)
 Glenda Hill  05/26/2024     @PREFPERIOPPHARMACY @   Your procedure is scheduled on  05/28/2024.   Report to Glenda Hill at  0845 A.M.   Call this number if you have problems the morning of surgery:  [phone]  If you experience any cold or flu symptoms such as cough, fever, chills, shortness of breath, etc. between now and your scheduled surgery, please notify us  at the above number.   Remember:  Do not eat after midnight.   You may drink clear liquids until 0645 am on 05/28/2024.    Clear liquids allowed are:                    Water, Juice (No red color; non-citric and without pulp; diabetics please choose diet or no sugar options), Carbonated beverages (diabetics please choose diet or no sugar options), Clear Tea (No creamer, milk, or cream, including half & half and powdered creamer), Black Coffee Only (No creamer, milk or cream, including half & half and powdered creamer), and Clear Sports drink (No red color; diabetics please choose diet or no sugar options)    Take these medicines the morning of surgery with A SIP OF WATER                amlodipine , carvedilol, pantoprazole , tamoxifen .    Do not wear jewelry, make-up or nail polish, including gel polish,  artificial nails, or any other type of covering on natural nails (fingers and  toes).  Do not wear lotions, powders, or perfumes, or deodorant.  Do not shave 48 hours prior to surgery.  Men may shave face and neck.  Do not bring valuables to the hospital.  [facility] is not responsible for any belongings or valuables.  Contacts, dentures or bridgework may not be worn into surgery.  Leave your suitcase in the car.  After surgery it may be brought to your room.  For patients admitted to the hospital, discharge time will be determined by your treatment team.  Patients discharged the day of surgery will not be allowed to drive home and must have someone with them for 24 hours.    Special instructions:    DO NOT smoke tobacco or vape for 24 hours before your procedure.  Please read over the following fact sheets that you were given. Anesthesia Post-op Instructions and Care and Recovery After Surgery      Colonoscopy, Adult, Care After The following information offers guidance on how to care for yourself after your procedure. Your health care provider may also give you more specific instructions. If you have problems or questions, contact your health care provider. What can I expect after the procedure? After the procedure, it is common to have: A small amount of blood in your stool for 24 hours after the procedure. Some gas. Mild cramping or bloating of your abdomen. Follow these instructions at home: Eating and drinking  Drink enough fluid to keep your urine pale yellow. Follow instructions from your health care provider about eating or drinking restrictions. Resume your normal diet as told by your health care provider. Avoid heavy or fried foods that are hard to digest. Activity Rest as told by your health care provider. Avoid sitting for a long time without moving. Get up to take short walks every 1-2 hours. This is important to improve blood flow and breathing. Ask for help if you feel weak or unsteady. Return to your normal activities as  told by your health care provider. Ask your health care provider what activities are safe for you. Managing cramping and bloating  Try walking around when you have cramps or feel bloated. If directed, apply heat to your abdomen as told by your health care provider. Use the heat source that your health care provider recommends, such as a moist heat pack or a heating pad. Place a towel between your skin and the heat source. Leave the heat on for 20-30 minutes. Remove the heat if your skin turns bright red. This is especially important if you are unable to feel pain, heat, or cold. You have a greater risk of getting burned. General instructions If  you were given a sedative during the procedure, it can affect you for several hours. Do not drive or operate machinery until your health care provider says that it is safe. For the first 24 hours after the procedure: Do not sign important documents. Do not drink alcohol. Do your regular daily activities at a slower pace than normal. Eat soft foods that are easy to digest. Take over-the-counter and prescription medicines only as told by your health care provider. Keep all follow-up visits. This is important. Contact a health care provider if: You have blood in your stool 2-3 days after the procedure. Get help right away if: You have more than a small spotting of blood in your stool. You have large blood clots in your stool. You have swelling of your abdomen. You have nausea or vomiting. You have a fever. You have increasing pain in your abdomen that is not relieved with medicine. These symptoms may be an emergency. Get help right away. Call 911. Do not wait to see if the symptoms will go away. Do not drive yourself to the hospital. Summary After the procedure, it is common to have a small amount of blood in your stool. You may also have mild cramping and bloating of your abdomen. If you were given a sedative during the procedure, it can affect you for several hours. Do not drive or operate machinery until your health care provider says that it is safe. Get help right away if you have a lot of blood in your stool, nausea or vomiting, a fever, or increased pain in your abdomen. This information is not intended to replace advice given to you by your health care provider. Make sure you discuss any questions you have with your health care provider. Document Revised: 12/18/2022 Document Reviewed: 06/28/2021 Elsevier Patient Education  2024 [employer]General Anesthesia, Adult, Care After The following information offers guidance on how to care for yourself after your procedure. Your health  care provider may also give you more specific instructions. If you have problems or questions, contact your health care provider. What can I expect after the procedure? After the procedure, it is common for people to: Have pain or discomfort at the IV site. Have nausea or vomiting. Have a sore throat or hoarseness. Have trouble concentrating. Feel cold or chills. Feel weak, sleepy, or tired (fatigue). Have soreness and body aches. These can affect parts of the body that were not involved in surgery. Follow these instructions at home: For the time period you were told by your health care provider:  Rest. Do not participate in activities where you could fall or become injured. Do not drive or use machinery. Do not drink alcohol. Do not take sleeping pills or medicines that cause drowsiness. Do not make important decisions or sign legal documents. Do not  take care of children on your own. General instructions Drink enough fluid to keep your urine pale yellow. If you have sleep apnea, surgery and certain medicines can increase your risk for breathing problems. Follow instructions from your health care provider about wearing your sleep device: Anytime you are sleeping, including during daytime naps. While taking prescription pain medicines, sleeping medicines, or medicines that make you drowsy. Return to your normal activities as told by your health care provider. Ask your health care provider what activities are safe for you. Take over-the-counter and prescription medicines only as told by your health care provider. Do not use any products that contain nicotine or tobacco. These products include cigarettes, chewing tobacco, and vaping devices, such as e-cigarettes. These can delay incision healing after surgery. If you need help quitting, ask your health care provider. Contact a health care provider if: You have nausea or vomiting that does not get better with medicine. You vomit every time  you eat or drink. You have pain that does not get better with medicine. You cannot urinate or have bloody urine. You develop a skin rash. You have a fever. Get help right away if: You have trouble breathing. You have chest pain. You vomit blood. These symptoms may be an emergency. Get help right away. Call 911. Do not wait to see if the symptoms will go away. Do not drive yourself to the hospital. Summary After the procedure, it is common to have a sore throat, hoarseness, nausea, vomiting, or to feel weak, sleepy, or fatigue. For the time period you were told by your health care provider, do not drive or use machinery. Get help right away if you have difficulty breathing, have chest pain, or vomit blood. These symptoms may be an emergency. This information is not intended to replace advice given to you by your health care provider. Make sure you discuss any questions you have with your health care provider. Document Revised: 02/02/2022 Document Reviewed: 02/02/2022 Elsevier Patient Education  2024 [employer].

## 2024-05-28 ENCOUNTER — Ambulatory Visit (HOSPITAL_COMMUNITY): Admitting: Certified Registered Nurse Anesthetist

## 2024-05-28 ENCOUNTER — Encounter (HOSPITAL_COMMUNITY): Payer: Self-pay | Admitting: Gastroenterology

## 2024-05-28 ENCOUNTER — Encounter (HOSPITAL_COMMUNITY)
Admission: RE | Disposition: A | Discharge status: Home / Self Care | Payer: Self-pay | Source: Home / Self Care | Attending: Gastroenterology

## 2024-05-28 ENCOUNTER — Other Ambulatory Visit: Payer: Self-pay

## 2024-05-28 ENCOUNTER — Ambulatory Visit (HOSPITAL_COMMUNITY)
Admission: RE | Admit: 2024-05-28 | Discharge: 2024-05-28 | Disposition: A | Discharge status: Home / Self Care | Attending: Gastroenterology | Admitting: Gastroenterology

## 2024-05-28 ENCOUNTER — Encounter (HOSPITAL_COMMUNITY)

## 2024-05-28 DIAGNOSIS — K5732 Diverticulitis of large intestine without perforation or abscess without bleeding: Secondary | ICD-10-CM

## 2024-05-28 DIAGNOSIS — K219 Gastro-esophageal reflux disease without esophagitis: Secondary | ICD-10-CM | POA: Insufficient documentation

## 2024-05-28 DIAGNOSIS — K5710 Diverticulosis of small intestine without perforation or abscess without bleeding: Secondary | ICD-10-CM | POA: Insufficient documentation

## 2024-05-28 DIAGNOSIS — K5730 Diverticulosis of large intestine without perforation or abscess without bleeding: Secondary | ICD-10-CM | POA: Insufficient documentation

## 2024-05-28 DIAGNOSIS — K635 Polyp of colon: Secondary | ICD-10-CM

## 2024-05-28 DIAGNOSIS — F17210 Nicotine dependence, cigarettes, uncomplicated: Secondary | ICD-10-CM | POA: Insufficient documentation

## 2024-05-28 DIAGNOSIS — Z8249 Family history of ischemic heart disease and other diseases of the circulatory system: Secondary | ICD-10-CM | POA: Insufficient documentation

## 2024-05-28 DIAGNOSIS — I10 Essential (primary) hypertension: Secondary | ICD-10-CM

## 2024-05-28 DIAGNOSIS — Z933 Colostomy status: Secondary | ICD-10-CM | POA: Insufficient documentation

## 2024-05-28 DIAGNOSIS — F17200 Nicotine dependence, unspecified, uncomplicated: Secondary | ICD-10-CM

## 2024-05-28 DIAGNOSIS — Z980 Intestinal bypass and anastomosis status: Secondary | ICD-10-CM | POA: Insufficient documentation

## 2024-05-28 DIAGNOSIS — J449 Chronic obstructive pulmonary disease, unspecified: Secondary | ICD-10-CM | POA: Diagnosis not present

## 2024-05-28 DIAGNOSIS — D120 Benign neoplasm of cecum: Secondary | ICD-10-CM | POA: Insufficient documentation

## 2024-05-28 DIAGNOSIS — Z09 Encounter for follow-up examination after completed treatment for conditions other than malignant neoplasm: Secondary | ICD-10-CM | POA: Insufficient documentation

## 2024-05-28 HISTORY — PX: COLONOSCOPY: SHX5424

## 2024-05-28 LAB — HM COLONOSCOPY

## 2024-05-28 SURGERY — COLONOSCOPY
Anesthesia: General

## 2024-05-28 MED ORDER — LACTATED RINGERS IV SOLN
INTRAVENOUS | Status: DC | PRN
Start: 2024-05-28 — End: 2024-05-28

## 2024-05-28 MED ORDER — PROPOFOL 500 MG/50ML IV EMUL
INTRAVENOUS | Status: DC | PRN
  Administered 2024-05-28: 150 ug/kg/min via INTRAVENOUS
  Administered 2024-05-28: 80 mg via INTRAVENOUS
  Administered 2024-05-28: 50 mg via INTRAVENOUS

## 2024-05-28 NOTE — H&P (Signed)
 Primary Care Physician:  Rosamond Leta NOVAK, MD Primary Gastroenterologist:  Dr. Cinderella  Pre-Procedure History & Physical: HPI:  Glenda Hill is a 68 y.o. 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, diagnosis of breast cancer (05/2023) who presents for evaluation of colonoscopy for preoperative assessment for reversal of the ostomy    Patient never had a colonoscopy, has chronic underlying constipation, significant NSAID use and active smoker are all risk factors for diverticulitis   Past Medical History:  Diagnosis Date   Colostomy in place [facility])    Complication of anesthesia    trouble waking up after C section   COPD (chronic obstructive pulmonary disease) (HCC)    Diverticulitis    Ductal carcinoma in situ (DCIS) of left breast 2024   GERD (gastroesophageal reflux disease)    Hypertension     Past Surgical History:  Procedure Laterality Date   BACK SURGERY     x2 2014 and 2018   BREAST BIOPSY Left 06/03/2023   MM LT BREAST BX W LOC DEV 1ST LESION IMAGE BX SPEC STEREO GUIDE 06/03/2023 [facility]   BREAST BIOPSY  07/02/2023   MM LT RADIOACTIVE SEED LOC MAMMO GUIDE 07/02/2023 [facility]   BREAST LUMPECTOMY WITH RADIOACTIVE SEED LOCALIZATION Left 07/04/2023   Procedure: LEFT BREAST LUMPECTOMY WITH RADIOACTIVE SEED LOCALIZATION;  Surgeon: Vanderbilt Ned, MD;  Location: [facility];  Service: General;  Laterality: Left;   CESAREAN SECTION     COLECTOMY  09/2021   TONSILLECTOMY     removed as a child    Prior to Admission medications   Medication Sig Start Date End Date Taking? Authorizing Provider  amLODipine  (NORVASC ) 10 MG tablet Take 10 mg by mouth daily. 05/13/23  Yes [provider]  Calcium Carb-Cholecalciferol  600-20 MG-MCG TABS Take 2 tablets by mouth daily.   Yes [provider]  carvedilol (COREG) 12.5 MG tablet Take 12.5 mg by mouth 2 (two) times daily.    Yes [provider]  ibuprofen (ADVIL,MOTRIN) 200 MG tablet Take 400 mg by mouth every 6 (six) hours as needed for headache (pain).   Yes [provider]  levocetirizine (XYZAL) 5 MG tablet Take 5 mg by mouth daily. 01/22/24  Yes [provider]  losartan  (COZAAR ) 100 MG tablet Take 100 mg by mouth daily. 01/30/24  Yes [provider]  meloxicam (MOBIC) 7.5 MG tablet Take 7.5 mg by mouth daily. 04/28/24  Yes [provider]  Multiple Vitamin (MULTIVITAMIN WITH MINERALS) TABS tablet Take 1 tablet by mouth daily. Woman   Yes [provider]  pantoprazole  (PROTONIX ) 40 MG tablet Take 40 mg by mouth daily. 04/07/23  Yes [provider]  tamoxifen  (NOLVADEX ) 10 MG tablet Take 1 tablet (10 mg total) by mouth daily. 09/20/23  Yes Gudena, Vinay, MD  TRELEGY ELLIPTA 100-62.5-25 MCG/ACT AEPB Inhale 1 puff into the lungs daily. 06/07/23  Yes [provider]  Vitamin D , Ergocalciferol , (DRISDOL) 1.25 MG (50000 UNIT) CAPS capsule Take 50,000 Units by mouth once a week. 06/07/23  Yes [provider]    Allergies as of 05/11/2024   (No Known Allergies)    Family History  Problem Relation Age of Onset   Heart disease Mother    Lung cancer Mother 69       smoking hx   Emphysema Mother        smoking hx   Ovarian cancer Sister  dx 30-50   Cervical cancer Paternal Grandmother        dx unknown age   Arthritis Other    Lung disease Other    Diabetes Other    Cancer Cousin 59       stomach and lung; pat female cousin    Social History   Socioeconomic History   Marital status: Significant Other    Spouse name: Not on file   Number of children: 1   Years of education: 11   Highest education level: Not on file  Occupational History   Not on file  Tobacco Use   Smoking status: Every Day    Current packs/day: 1.00    Average packs/day: 1 pack/day for 51.5 years (51.5 ttl pk-yrs)    Types: Cigarettes    Start date:  24   Smokeless tobacco: Never   Tobacco comments:    stopped for a while   Vaping Use   Vaping status: Never Used  Substance and Sexual Activity   Alcohol use: Not Currently   Drug use: No   Sexual activity: Not on file  Other Topics Concern   Not on file  Social History Narrative   Not on file   Social Drivers of Health   Financial Resource Strain: Not on file  Food Insecurity: No Food Insecurity (08/01/2023)   Hunger Vital Sign    Worried About Running Out of Food in the Last Year: Never true    Ran Out of Food in the Last Year: Never true  Transportation Needs: No Transportation Needs (08/01/2023)   PRAPARE - Administrator, Civil Service (Medical): No    Lack of Transportation (Non-Medical): No  Physical Activity: Not on file  Stress: Not on file  Social Connections: Not on file  Intimate Partner Violence: Not At Risk (08/01/2023)   Humiliation, Afraid, Rape, and Kick questionnaire    Fear of Current or Ex-Partner: No    Emotionally Abused: No    Physically Abused: No    Sexually Abused: No    Review of Systems: See HPI, otherwise negative ROS  Physical Exam: Vital signs in last 24 hours: Temp:  [98.1 F (36.7 C)] 98.1 F (36.7 C) (07/10 0857) Pulse Rate:  [70] 70 (07/10 0857) Resp:  [22] 22 (07/10 0857) BP: (158)/(90) 158/90 (07/10 0857) SpO2:  [98 %] 98 % (07/10 0857)   General:   Alert,  Well-developed, well-nourished, pleasant and cooperative in NAD Head:  Normocephalic and atraumatic. Eyes:  Sclera clear, no icterus.   Conjunctiva pink. Ears:  Normal auditory acuity. Nose:  No deformity, discharge,  or lesions. Msk:  Symmetrical without gross deformities. Normal posture. Extremities:  Without clubbing or edema. Neurologic:  Alert and  oriented x4;  grossly normal neurologically. Skin:  Intact without significant lesions or rashes. Psych:  Alert and cooperative. Normal mood and affect.  Impression/Plan: Glenda Hill is a 68 y.o.  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, diagnosis of breast cancer (05/2023) who presents for evaluation of colonoscopy for preoperative assessment for reversal of the ostomy   Proceed with  colonoscopy via ostomy and also rectal stump to examine/evaluate and clear colon of any lesion prior to ostomy reversal   The risks of the procedure including infection, bleed, or perforation as well as benefits, limitations, alternatives and imponderables have been reviewed with the patient. Questions have been answered. All parties agreeable.

## 2024-05-28 NOTE — Anesthesia Preprocedure Evaluation (Signed)
 Anesthesia Evaluation  Patient identified by MRN, date of birth, ID band Patient awake    Reviewed: Allergy & Precautions, H&P , NPO status , Patient's Chart, lab work & pertinent test results, reviewed documented beta blocker date and time   History of Anesthesia Complications (+) history of anesthetic complications  Airway Mallampati: II  TM Distance: >3 FB Neck ROM: full    Dental no notable dental hx.    Pulmonary COPD, Current Smoker and Patient abstained from smoking.   Pulmonary exam normal breath sounds clear to auscultation       Cardiovascular Exercise Tolerance: Good hypertension,  Rhythm:regular Rate:Normal     Neuro/Psych negative neurological ROS  negative psych ROS   GI/Hepatic Neg liver ROS,GERD  ,,  Endo/Other  negative endocrine ROS    Renal/GU negative Renal ROS  negative genitourinary   Musculoskeletal   Abdominal   Peds  Hematology negative hematology ROS (+)   Anesthesia Other Findings   Reproductive/Obstetrics negative OB ROS                              Anesthesia Physical Anesthesia Plan  ASA: 3  Anesthesia Plan: General   Post-op Pain Management:    Induction:   PONV Risk Score and Plan: Propofol  infusion  Airway Management Planned:   Additional Equipment:   Intra-op Plan:   Post-operative Plan:   Informed Consent: I have reviewed the patients History and Physical, chart, labs and discussed the procedure including the risks, benefits and alternatives for the proposed anesthesia with the patient or authorized representative who has indicated his/her understanding and acceptance.     Dental Advisory Given  Plan Discussed with: CRNA  Anesthesia Plan Comments:         Anesthesia Quick Evaluation

## 2024-05-28 NOTE — Transfer of Care (Signed)
 Immediate Anesthesia Transfer of Care Note  Patient: Glenda Hill  Procedure(s) Performed: COLONOSCOPY  Patient Location: Short Stay  Anesthesia Type:General  Level of Consciousness: awake and alert   Airway & Oxygen Therapy: Patient Spontanous Breathing  Post-op Assessment: Report given to RN and Post -op Vital signs reviewed and stable  Post vital signs: Reviewed and stable  Last Vitals:  Vitals Value Taken Time  BP 146/99 05/28/24 10:15  Temp 36.4 C 05/28/24 10:15  Pulse 72 05/28/24 10:15  Resp 16 05/28/24 10:15  SpO2 97 % 05/28/24 10:15    Last Pain:  Vitals:   05/28/24 1015  TempSrc: Oral  PainSc: 0-No pain         Complications: No notable events documented.

## 2024-05-28 NOTE — Op Note (Addendum)
 [facility] Patient Name: Glenda Hill Procedure Date: 05/28/2024 9:10 AM MRN: [id number] Date of Birth: [birth year] Attending MD: Deatrice Dine , MD, [id number] CSN: [id number] Age: 68 Admit Type: Outpatient Procedure:                Colonoscopy Indications:              Follow-up of diverticulitis, history of partial                            coloectomy with end colostomy and closure of distal                            segment (Hartmann Type procedure ) -10/03/2021 at                            [facility], Preoperative assessement Providers:                Deatrice Dine, MD, Rosina Sprague, Madelin Hunter, RN,                            Daphne Mulch Technician, Technician Referring MD:              Medicines:                Monitored Anesthesia Care Complications:            No immediate complications. Estimated Blood Loss:     Estimated blood loss: none. Procedure:                Pre-Anesthesia Assessment:                           - Prior to the procedure, a History and Physical                            was performed, and patient medications and                            allergies were reviewed. The patient's tolerance of                            previous anesthesia was also reviewed. The risks                            and benefits of the procedure and the sedation                            options and risks were discussed with the patient.                            All questions were answered, and informed consent                            was obtained. Prior Anticoagulants: The patient has  taken no anticoagulant or antiplatelet agents. ASA                            Grade Assessment: II - A patient with mild systemic                            disease. After reviewing the risks and benefits,                            the patient was deemed in satisfactory condition to                            undergo the procedure.                            After obtaining informed consent, the colonoscope                            was passed under direct vision. Throughout the                            procedure, the patient's blood pressure, pulse, and                            oxygen saturations were monitored continuously. The                            PCF-HQ190L ([id number]) scope was introduced through                            the anus and advanced to the the cecum, identified                            by appendiceal orifice and ileocecal valve. The                            colonoscopy was performed without difficulty. The                            patient tolerated the procedure well. The quality                            of the bowel preparation was evaluated using the                            BBPS [facility] Bowel Preparation Scale) with scores                            of: Right Colon = 3, Transverse Colon = 3 and Left                            Colon = 3 (entire mucosa seen well with no residual  staining, small fragments of stool or opaque                            liquid). The total BBPS score equals 9. The                            ileocecal valve, appendiceal orifice, and rectum                            were photographed. Scope In: 9:33:01 AM Scope Out: 10:07:18 AM Scope Withdrawal Time: 0 hours 10 minutes 22 seconds  Total Procedure Duration: 0 hours 34 minutes 17 seconds  Findings:      The perianal and digital rectal examinations were normal.      A small amount of stool was found in the rectum.      A 5 mm polyp was found in the cecum. The polyp was sessile. The polyp       was removed with a cold snare. Resection and retrieval were complete.      There was evidence of a widely patent loop colostomy in the sigmoid       colon. This was characterized by healthy appearing mucosa. Impression:               - Stool in the rectum. 25cm of rectum and sigmoid                             colon seen from colonoscopy performed via anal verge                           - One 5 mm polyp in the cecum, removed with a cold                            snare. Resected and retrieved.                           - Widely patent loop colostomy with healthy                            appearing mucosa in the sigmoid colon.                           -Ostomy site has two opening , both openings                            examined . One opening reaching to surgical site(                            30 cm in length) and other opening leading to the                            cecum . No apparent masses                           -incomplete insufflation due to CO2 leaking through  the ostomy and hence small polyps may be missed. No                            apparent masses seen                           -Pandiverticulosis Moderate Sedation:      Per Anesthesia Care Recommendation:           - Patient has a contact number available for                            emergencies. The signs and symptoms of potential                            delayed complications were discussed with the                            patient. Return to normal activities tomorrow.                            Written discharge instructions were provided to the                            patient.                           - Resume previous diet.                           - Continue present medications.                           - Await pathology results.                           - Repeat colonoscopy in 1 year for surveillance                            after surgery .                           - Return to GI clinic as previously scheduled. Procedure Code(s):        --- Professional ---                           [phone], Colonoscopy, flexible; with removal of                            tumor(s), polyp(s), or other lesion(s) by snare                            technique Diagnosis Code(s):        ---  Professional ---                           Z98.0, Intestinal bypass and anastomosis status  D12.0, Benign neoplasm of cecum                           K57.32, Diverticulitis of large intestine without                            perforation or abscess without bleeding CPT copyright 2022 [employer]. All rights reserved. The codes documented in this report are preliminary and upon coder review may  be revised to meet current compliance requirements. Deatrice Dine, MD Deatrice Dine, MD 05/28/2024 10:32:50 AM This report has been signed electronically. Number of Addenda: 0

## 2024-05-29 ENCOUNTER — Encounter (HOSPITAL_COMMUNITY): Payer: Self-pay | Admitting: Gastroenterology

## 2024-05-29 ENCOUNTER — Encounter (INDEPENDENT_AMBULATORY_CARE_PROVIDER_SITE_OTHER): Payer: Self-pay | Admitting: *Deleted

## 2024-05-29 NOTE — Anesthesia Postprocedure Evaluation (Signed)
 Anesthesia Post Note  Patient: Glenda Hill  Procedure(s) Performed: COLONOSCOPY  Patient location during evaluation: Phase II Anesthesia Type: General Level of consciousness: awake Pain management: pain level controlled Vital Signs Assessment: post-procedure vital signs reviewed and stable Respiratory status: spontaneous breathing and respiratory function stable Cardiovascular status: blood pressure returned to baseline and stable Postop Assessment: no headache and no apparent nausea or vomiting Anesthetic complications: no Comments: Late entry   No notable events documented.   Last Vitals:  Vitals:   05/28/24 0857 05/28/24 1015  BP: (!) 158/90 (!) 146/99  Pulse: 70 72  Resp: (!) 22 16  Temp: 36.7 C (!) 36.4 C  SpO2: 98% 97%    Last Pain:  Vitals:   05/28/24 1015  TempSrc: Oral  PainSc: 0-No pain                 Yvonna JINNY Bosworth

## 2024-06-01 LAB — SURGICAL PATHOLOGY

## 2024-06-03 ENCOUNTER — Encounter (HOSPITAL_COMMUNITY): Payer: Self-pay | Admitting: Urology

## 2024-06-03 ENCOUNTER — Inpatient Hospital Stay (HOSPITAL_COMMUNITY): Payer: Self-pay | Admitting: Medical

## 2024-06-03 ENCOUNTER — Inpatient Hospital Stay (HOSPITAL_COMMUNITY): Payer: Self-pay

## 2024-06-03 ENCOUNTER — Other Ambulatory Visit: Payer: Self-pay

## 2024-06-03 ENCOUNTER — Inpatient Hospital Stay (HOSPITAL_COMMUNITY)
Admission: RE | Admit: 2024-06-03 | Discharge: 2024-06-08 | DRG: 330 | Disposition: A | Discharge status: Home / Self Care | Attending: Surgery | Admitting: Surgery

## 2024-06-03 ENCOUNTER — Encounter (HOSPITAL_COMMUNITY)
Admission: RE | Disposition: A | Discharge status: Home / Self Care | Payer: Self-pay | Source: Ambulatory Visit | Attending: Surgery

## 2024-06-03 DIAGNOSIS — Z791 Long term (current) use of non-steroidal anti-inflammatories (NSAID): Secondary | ICD-10-CM

## 2024-06-03 DIAGNOSIS — N852 Hypertrophy of uterus: Secondary | ICD-10-CM | POA: Diagnosis present

## 2024-06-03 DIAGNOSIS — Z8049 Family history of malignant neoplasm of other genital organs: Secondary | ICD-10-CM

## 2024-06-03 DIAGNOSIS — Z433 Encounter for attention to colostomy: Principal | ICD-10-CM

## 2024-06-03 DIAGNOSIS — K5669 Other partial intestinal obstruction: Secondary | ICD-10-CM | POA: Diagnosis present

## 2024-06-03 DIAGNOSIS — Z801 Family history of malignant neoplasm of trachea, bronchus and lung: Secondary | ICD-10-CM

## 2024-06-03 DIAGNOSIS — Z8719 Personal history of other diseases of the digestive system: Secondary | ICD-10-CM

## 2024-06-03 DIAGNOSIS — F17210 Nicotine dependence, cigarettes, uncomplicated: Secondary | ICD-10-CM

## 2024-06-03 DIAGNOSIS — Z825 Family history of asthma and other chronic lower respiratory diseases: Secondary | ICD-10-CM

## 2024-06-03 DIAGNOSIS — N83292 Other ovarian cyst, left side: Secondary | ICD-10-CM | POA: Diagnosis present

## 2024-06-03 DIAGNOSIS — Z79899 Other long term (current) drug therapy: Secondary | ICD-10-CM

## 2024-06-03 DIAGNOSIS — K631 Perforation of intestine (nontraumatic): Secondary | ICD-10-CM

## 2024-06-03 DIAGNOSIS — K5720 Diverticulitis of large intestine with perforation and abscess without bleeding: Secondary | ICD-10-CM | POA: Diagnosis present

## 2024-06-03 DIAGNOSIS — N738 Other specified female pelvic inflammatory diseases: Secondary | ICD-10-CM | POA: Diagnosis present

## 2024-06-03 DIAGNOSIS — N736 Female pelvic peritoneal adhesions (postinfective): Secondary | ICD-10-CM | POA: Diagnosis present

## 2024-06-03 DIAGNOSIS — Z7951 Long term (current) use of inhaled steroids: Secondary | ICD-10-CM | POA: Diagnosis not present

## 2024-06-03 DIAGNOSIS — K56699 Other intestinal obstruction unspecified as to partial versus complete obstruction: Principal | ICD-10-CM | POA: Diagnosis present

## 2024-06-03 DIAGNOSIS — J449 Chronic obstructive pulmonary disease, unspecified: Secondary | ICD-10-CM | POA: Diagnosis present

## 2024-06-03 DIAGNOSIS — Z933 Colostomy status: Secondary | ICD-10-CM

## 2024-06-03 DIAGNOSIS — Z833 Family history of diabetes mellitus: Secondary | ICD-10-CM | POA: Diagnosis not present

## 2024-06-03 DIAGNOSIS — E876 Hypokalemia: Secondary | ICD-10-CM | POA: Diagnosis present

## 2024-06-03 DIAGNOSIS — Z8249 Family history of ischemic heart disease and other diseases of the circulatory system: Secondary | ICD-10-CM

## 2024-06-03 DIAGNOSIS — K435 Parastomal hernia without obstruction or  gangrene: Secondary | ICD-10-CM | POA: Diagnosis present

## 2024-06-03 DIAGNOSIS — Z8041 Family history of malignant neoplasm of ovary: Secondary | ICD-10-CM

## 2024-06-03 DIAGNOSIS — Z9012 Acquired absence of left breast and nipple: Secondary | ICD-10-CM

## 2024-06-03 DIAGNOSIS — F419 Anxiety disorder, unspecified: Secondary | ICD-10-CM | POA: Diagnosis present

## 2024-06-03 DIAGNOSIS — K432 Incisional hernia without obstruction or gangrene: Secondary | ICD-10-CM | POA: Diagnosis present

## 2024-06-03 DIAGNOSIS — K219 Gastro-esophageal reflux disease without esophagitis: Secondary | ICD-10-CM | POA: Diagnosis present

## 2024-06-03 DIAGNOSIS — I10 Essential (primary) hypertension: Secondary | ICD-10-CM

## 2024-06-03 DIAGNOSIS — R739 Hyperglycemia, unspecified: Secondary | ICD-10-CM

## 2024-06-03 DIAGNOSIS — Z86000 Personal history of in-situ neoplasm of breast: Secondary | ICD-10-CM

## 2024-06-03 DIAGNOSIS — Z860100 Personal history of colon polyps, unspecified: Secondary | ICD-10-CM

## 2024-06-03 DIAGNOSIS — Z720 Tobacco use: Secondary | ICD-10-CM | POA: Diagnosis present

## 2024-06-03 HISTORY — PX: COLECTOMY, SIGMOID, ROBOT-ASSISTED: SHX7542

## 2024-06-03 HISTORY — PX: ROBOTIC ASSISTED LAPAROSCOPIC LYSIS OF ADHESION: SHX6080

## 2024-06-03 HISTORY — PX: CYSTOSCOPY WITH INDOCYANINE GREEN IMAGING (ICG): SHX7549

## 2024-06-03 HISTORY — PX: XI ROBOTIC ASSISTED COLOSTOMY TAKEDOWN: SHX6828

## 2024-06-03 HISTORY — PX: FLEXIBLE SIGMOIDOSCOPY: SHX5431

## 2024-06-03 SURGERY — CYSTOSCOPY WITH INDOCYANINE GREEN IMAGING (ICG)
Anesthesia: General | Site: Abdomen

## 2024-06-03 MED ORDER — ENSURE PRE-SURGERY PO LIQD: 592.0000 mL | Freq: Once | ORAL | Status: DC

## 2024-06-03 MED ORDER — LACTATED RINGERS IR SOLN
Status: DC | PRN
  Administered 2024-06-03: 1000 mL

## 2024-06-03 MED ORDER — OYSTER SHELL CALCIUM/D3 500-5 MG-MCG PO TABS
2.0000 | ORAL_TABLET | Freq: Every day | ORAL | Status: DC
  Administered 2024-06-04 – 2024-06-08 (×5): 2 via ORAL
  Filled 2024-06-03 (×5): qty 2

## 2024-06-03 MED ORDER — ENOXAPARIN SODIUM 40 MG/0.4ML IJ SOSY
40.0000 mg | PREFILLED_SYRINGE | Freq: Once | INTRAMUSCULAR | Status: AC
  Administered 2024-06-03: 40 mg via SUBCUTANEOUS
  Filled 2024-06-03: qty 0.4

## 2024-06-03 MED ORDER — CHLORHEXIDINE GLUCONATE 0.12 % MT SOLN
15.0000 mL | Freq: Once | OROMUCOSAL | Status: AC
  Administered 2024-06-03: 15 mL via OROMUCOSAL

## 2024-06-03 MED ORDER — PHENYLEPHRINE HCL-NACL 20-0.9 MG/250ML-% IV SOLN
INTRAVENOUS | Status: DC | PRN
Start: 2024-06-03 — End: 2024-06-03
  Administered 2024-06-03: 40 ug/min via INTRAVENOUS

## 2024-06-03 MED ORDER — FENTANYL CITRATE (PF) 250 MCG/5ML IJ SOLN
INTRAMUSCULAR | Status: AC
  Filled 2024-06-03: qty 5

## 2024-06-03 MED ORDER — ONDANSETRON HCL 4 MG/2ML IJ SOLN
INTRAMUSCULAR | Status: AC
Start: 2024-06-03 — End: 2024-06-03
  Filled 2024-06-03: qty 2

## 2024-06-03 MED ORDER — BISACODYL 5 MG PO TBEC: 20.0000 mg | DELAYED_RELEASE_TABLET | Freq: Once | ORAL | Status: DC

## 2024-06-03 MED ORDER — KCL IN DEXTROSE-NACL 20-5-0.45 MEQ/L-%-% IV SOLN
INTRAVENOUS | Status: DC
  Filled 2024-06-03: qty 1000

## 2024-06-03 MED ORDER — ALVIMOPAN 12 MG PO CAPS
12.0000 mg | ORAL_CAPSULE | ORAL | Status: AC
  Administered 2024-06-03: 12 mg via ORAL
  Filled 2024-06-03: qty 1

## 2024-06-03 MED ORDER — BUPIVACAINE LIPOSOME 1.3 % IJ SUSP: 20.0000 mL | Freq: Once | INTRAMUSCULAR | Status: DC

## 2024-06-03 MED ORDER — MELATONIN 3 MG PO TABS
3.0000 mg | ORAL_TABLET | Freq: Every evening | ORAL | Status: DC | PRN
  Administered 2024-06-07: 3 mg via ORAL
  Filled 2024-06-03: qty 1

## 2024-06-03 MED ORDER — LIDOCAINE HCL (PF) 2 % IJ SOLN
INTRAMUSCULAR | Status: AC
Start: 2024-06-03 — End: 2024-06-03
  Filled 2024-06-03: qty 5

## 2024-06-03 MED ORDER — NEOMYCIN SULFATE 500 MG PO TABS: 1000.0000 mg | ORAL_TABLET | ORAL | Status: DC

## 2024-06-03 MED ORDER — KETAMINE HCL 50 MG/5ML IJ SOSY
PREFILLED_SYRINGE | INTRAMUSCULAR | Status: AC
  Filled 2024-06-03: qty 5

## 2024-06-03 MED ORDER — HYDROMORPHONE HCL 1 MG/ML IJ SOLN
0.5000 mg | INTRAMUSCULAR | Status: DC | PRN
  Administered 2024-06-03 – 2024-06-06 (×6): 1 mg via INTRAVENOUS
  Filled 2024-06-03 (×6): qty 1

## 2024-06-03 MED ORDER — SODIUM CHLORIDE 0.9 % IV SOLN
2.0000 g | INTRAVENOUS | Status: AC
  Administered 2024-06-03: 2 g via INTRAVENOUS
  Filled 2024-06-03: qty 2

## 2024-06-03 MED ORDER — ENSURE SURGERY PO LIQD
237.0000 mL | Freq: Two times a day (BID) | ORAL | Status: DC
  Administered 2024-06-06 – 2024-06-08 (×5): 237 mL via ORAL

## 2024-06-03 MED ORDER — PROPOFOL 10 MG/ML IV BOLUS
INTRAVENOUS | Status: AC
Start: 2024-06-03 — End: 2024-06-03
  Filled 2024-06-03: qty 20

## 2024-06-03 MED ORDER — DIPHENHYDRAMINE HCL 12.5 MG/5ML PO ELIX: 12.5000 mg | ORAL_SOLUTION | Freq: Four times a day (QID) | ORAL | Status: DC | PRN

## 2024-06-03 MED ORDER — METRONIDAZOLE 500 MG PO TABS: 1000.0000 mg | ORAL_TABLET | ORAL | Status: DC

## 2024-06-03 MED ORDER — GABAPENTIN 100 MG PO CAPS
200.0000 mg | ORAL_CAPSULE | Freq: Every day | ORAL | Status: DC
  Administered 2024-06-03 – 2024-06-04 (×2): 200 mg via ORAL
  Filled 2024-06-03 (×2): qty 2

## 2024-06-03 MED ORDER — PROPOFOL 1000 MG/100ML IV EMUL
INTRAVENOUS | Status: AC
  Filled 2024-06-03: qty 100

## 2024-06-03 MED ORDER — SODIUM CHLORIDE 0.9 % IR SOLN
Status: DC | PRN
  Administered 2024-06-03: 2000 mL

## 2024-06-03 MED ORDER — SODIUM CHLORIDE 0.9 % IV SOLN: 2.0000 g | INTRAVENOUS | Status: DC

## 2024-06-03 MED ORDER — METOPROLOL TARTRATE 5 MG/5ML IV SOLN: 5.0000 mg | Freq: Four times a day (QID) | INTRAVENOUS | Status: DC | PRN

## 2024-06-03 MED ORDER — TAMOXIFEN CITRATE 10 MG PO TABS
10.0000 mg | ORAL_TABLET | Freq: Every day | ORAL | Status: DC
  Administered 2024-06-04 – 2024-06-08 (×5): 10 mg via ORAL
  Filled 2024-06-03 (×5): qty 1

## 2024-06-03 MED ORDER — AMLODIPINE BESYLATE 10 MG PO TABS
10.0000 mg | ORAL_TABLET | Freq: Every day | ORAL | Status: DC
  Administered 2024-06-04 – 2024-06-08 (×5): 10 mg via ORAL
  Filled 2024-06-03 (×5): qty 1

## 2024-06-03 MED ORDER — METHYLENE BLUE (ANTIDOTE) 1 % IV SOLN
INTRAVENOUS | Status: AC
  Filled 2024-06-03: qty 10

## 2024-06-03 MED ORDER — STERILE WATER FOR IRRIGATION IR SOLN
Status: DC | PRN
  Administered 2024-06-03: 1000 mL

## 2024-06-03 MED ORDER — SALINE SPRAY 0.65 % NA SOLN: 1.0000 | Freq: Four times a day (QID) | NASAL | Status: DC | PRN

## 2024-06-03 MED ORDER — MIDAZOLAM HCL 2 MG/2ML IJ SOLN
INTRAMUSCULAR | Status: AC
  Filled 2024-06-03: qty 2

## 2024-06-03 MED ORDER — SODIUM CHLORIDE 0.9% FLUSH
3.0000 mL | Freq: Two times a day (BID) | INTRAVENOUS | Status: DC
  Administered 2024-06-03 – 2024-06-08 (×10): 3 mL via INTRAVENOUS

## 2024-06-03 MED ORDER — DEXAMETHASONE SODIUM PHOSPHATE 10 MG/ML IJ SOLN
INTRAMUSCULAR | Status: AC
  Filled 2024-06-03: qty 1

## 2024-06-03 MED ORDER — VITAMIN D (ERGOCALCIFEROL) 1.25 MG (50000 UNIT) PO CAPS
50000.0000 [IU] | ORAL_CAPSULE | ORAL | Status: DC
  Administered 2024-06-04: 50000 [IU] via ORAL
  Filled 2024-06-03: qty 1

## 2024-06-03 MED ORDER — ONDANSETRON HCL 4 MG PO TABS: 4.0000 mg | ORAL_TABLET | Freq: Four times a day (QID) | ORAL | Status: DC | PRN

## 2024-06-03 MED ORDER — NICOTINE 14 MG/24HR TD PT24
14.0000 mg | MEDICATED_PATCH | Freq: Every day | TRANSDERMAL | Status: DC
  Administered 2024-06-03 – 2024-06-08 (×6): 14 mg via TRANSDERMAL
  Filled 2024-06-03 (×6): qty 1

## 2024-06-03 MED ORDER — PROCHLORPERAZINE EDISYLATE 10 MG/2ML IJ SOLN: 5.0000 mg | Freq: Four times a day (QID) | INTRAMUSCULAR | Status: DC | PRN

## 2024-06-03 MED ORDER — CARVEDILOL 12.5 MG PO TABS
12.5000 mg | ORAL_TABLET | Freq: Two times a day (BID) | ORAL | Status: DC
  Administered 2024-06-03 – 2024-06-08 (×10): 12.5 mg via ORAL
  Filled 2024-06-03 (×10): qty 1

## 2024-06-03 MED ORDER — ENSURE PRE-SURGERY PO LIQD: 296.0000 mL | Freq: Once | ORAL | Status: DC

## 2024-06-03 MED ORDER — BUDESON-GLYCOPYRROL-FORMOTEROL 160-9-4.8 MCG/ACT IN AERO
2.0000 | INHALATION_SPRAY | Freq: Two times a day (BID) | RESPIRATORY_TRACT | Status: DC
  Administered 2024-06-03 – 2024-06-08 (×10): 2 via RESPIRATORY_TRACT
  Filled 2024-06-03: qty 5.9

## 2024-06-03 MED ORDER — ALBUMIN HUMAN 5 % IV SOLN
INTRAVENOUS | Status: AC
  Filled 2024-06-03: qty 250

## 2024-06-03 MED ORDER — MENTHOL 3 MG MT LOZG: 1.0000 | LOZENGE | OROMUCOSAL | Status: DC | PRN

## 2024-06-03 MED ORDER — OXYCODONE HCL 5 MG/5ML PO SOLN: 5.0000 mg | Freq: Once | ORAL | Status: DC | PRN

## 2024-06-03 MED ORDER — LIDOCAINE HCL (CARDIAC) PF 100 MG/5ML IV SOSY
PREFILLED_SYRINGE | INTRAVENOUS | Status: DC | PRN
Start: 2024-06-03 — End: 2024-06-03
  Administered 2024-06-03: 80 mg via INTRAVENOUS

## 2024-06-03 MED ORDER — ADULT MULTIVITAMIN W/MINERALS CH
1.0000 | ORAL_TABLET | Freq: Every day | ORAL | Status: DC
  Administered 2024-06-04 – 2024-06-08 (×5): 1 via ORAL
  Filled 2024-06-03 (×5): qty 1

## 2024-06-03 MED ORDER — BUPIVACAINE-EPINEPHRINE (PF) 0.25% -1:200000 IJ SOLN
INTRAMUSCULAR | Status: DC | PRN
  Administered 2024-06-03: 60 mL

## 2024-06-03 MED ORDER — SUGAMMADEX SODIUM 200 MG/2ML IV SOLN
INTRAVENOUS | Status: DC | PRN
Start: 2024-06-03 — End: 2024-06-03
  Administered 2024-06-03: 200 mg via INTRAVENOUS

## 2024-06-03 MED ORDER — ALBUTEROL SULFATE (2.5 MG/3ML) 0.083% IN NEBU
2.5000 mg | INHALATION_SOLUTION | Freq: Four times a day (QID) | RESPIRATORY_TRACT | Status: DC | PRN
  Administered 2024-06-03: 2.5 mg via RESPIRATORY_TRACT
  Filled 2024-06-03: qty 3

## 2024-06-03 MED ORDER — DEXMEDETOMIDINE HCL IN NACL 80 MCG/20ML IV SOLN
INTRAVENOUS | Status: AC
  Filled 2024-06-03: qty 20

## 2024-06-03 MED ORDER — ROCURONIUM BROMIDE 10 MG/ML (PF) SYRINGE
PREFILLED_SYRINGE | INTRAVENOUS | Status: AC
  Filled 2024-06-03: qty 10

## 2024-06-03 MED ORDER — ENOXAPARIN SODIUM 40 MG/0.4ML IJ SOSY: 40.0000 mg | PREFILLED_SYRINGE | Freq: Once | INTRAMUSCULAR | Status: DC

## 2024-06-03 MED ORDER — DEXAMETHASONE SODIUM PHOSPHATE 10 MG/ML IJ SOLN
INTRAMUSCULAR | Status: DC | PRN
  Administered 2024-06-03: 10 mg via INTRAVENOUS

## 2024-06-03 MED ORDER — ACETAMINOPHEN 500 MG PO TABS: 1000.0000 mg | ORAL_TABLET | ORAL | Status: DC

## 2024-06-03 MED ORDER — DIPHENHYDRAMINE HCL 50 MG/ML IJ SOLN: 12.5000 mg | Freq: Four times a day (QID) | INTRAMUSCULAR | Status: DC | PRN

## 2024-06-03 MED ORDER — ALBUMIN HUMAN 5 % IV SOLN: INTRAVENOUS | Status: DC | PRN

## 2024-06-03 MED ORDER — ROCURONIUM BROMIDE 100 MG/10ML IV SOLN
INTRAVENOUS | Status: DC | PRN
  Administered 2024-06-03 (×2): 20 mg via INTRAVENOUS
  Administered 2024-06-03: 80 mg via INTRAVENOUS

## 2024-06-03 MED ORDER — POLYETHYLENE GLYCOL 3350 17 GM/SCOOP PO POWD: 238.0000 g | Freq: Once | ORAL | Status: DC

## 2024-06-03 MED ORDER — LACTATED RINGERS IV SOLN: INTRAVENOUS | Status: DC

## 2024-06-03 MED ORDER — MEPERIDINE HCL 25 MG/ML IJ SOLN: 6.2500 mg | INTRAMUSCULAR | Status: DC | PRN

## 2024-06-03 MED ORDER — STERILE WATER FOR INJECTION IJ SOLN
INTRAMUSCULAR | Status: DC | PRN
  Administered 2024-06-03: 20 mL via INTRAMUSCULAR

## 2024-06-03 MED ORDER — AMISULPRIDE (ANTIEMETIC) 5 MG/2ML IV SOLN: 10.0000 mg | Freq: Once | INTRAVENOUS | Status: DC | PRN

## 2024-06-03 MED ORDER — TRAMADOL HCL 50 MG PO TABS
50.0000 mg | ORAL_TABLET | Freq: Four times a day (QID) | ORAL | Status: DC | PRN
  Administered 2024-06-03 – 2024-06-05 (×4): 100 mg via ORAL
  Filled 2024-06-03 (×4): qty 2

## 2024-06-03 MED ORDER — GABAPENTIN 100 MG PO CAPS: 200.0000 mg | ORAL_CAPSULE | ORAL | Status: DC

## 2024-06-03 MED ORDER — PROPOFOL 500 MG/50ML IV EMUL
INTRAVENOUS | Status: DC | PRN
  Administered 2024-06-03: 85 ug/kg/min via INTRAVENOUS

## 2024-06-03 MED ORDER — PROPOFOL 10 MG/ML IV BOLUS
INTRAVENOUS | Status: DC | PRN
  Administered 2024-06-03: 140 mg via INTRAVENOUS

## 2024-06-03 MED ORDER — MIDAZOLAM HCL 5 MG/5ML IJ SOLN
INTRAMUSCULAR | Status: DC | PRN
Start: 2024-06-03 — End: 2024-06-03
  Administered 2024-06-03: 2 mg via INTRAVENOUS

## 2024-06-03 MED ORDER — ENSURE PRE-SURGERY PO LIQD
296.0000 mL | Freq: Once | ORAL | Status: DC
Start: 2024-06-04 — End: 2024-06-03

## 2024-06-03 MED ORDER — ALVIMOPAN 12 MG PO CAPS
12.0000 mg | ORAL_CAPSULE | Freq: Two times a day (BID) | ORAL | Status: DC
  Administered 2024-06-04 – 2024-06-05 (×2): 12 mg via ORAL
  Filled 2024-06-03 (×3): qty 1

## 2024-06-03 MED ORDER — HYDROMORPHONE HCL 2 MG/ML IJ SOLN
INTRAMUSCULAR | Status: AC
  Filled 2024-06-03: qty 1

## 2024-06-03 MED ORDER — BUPIVACAINE-EPINEPHRINE (PF) 0.25% -1:200000 IJ SOLN
INTRAMUSCULAR | Status: AC
  Filled 2024-06-03: qty 60

## 2024-06-03 MED ORDER — SODIUM CHLORIDE 0.9% FLUSH: 3.0000 mL | INTRAVENOUS | Status: DC | PRN

## 2024-06-03 MED ORDER — LOSARTAN POTASSIUM 50 MG PO TABS
100.0000 mg | ORAL_TABLET | Freq: Every day | ORAL | Status: DC
  Administered 2024-06-03 – 2024-06-08 (×6): 100 mg via ORAL
  Filled 2024-06-03 (×6): qty 2

## 2024-06-03 MED ORDER — ALUM & MAG HYDROXIDE-SIMETH 200-200-20 MG/5ML PO SUSP: 30.0000 mL | Freq: Four times a day (QID) | ORAL | Status: DC | PRN

## 2024-06-03 MED ORDER — LORATADINE 10 MG PO TABS
10.0000 mg | ORAL_TABLET | Freq: Every day | ORAL | Status: DC
  Administered 2024-06-04 – 2024-06-08 (×5): 10 mg via ORAL
  Filled 2024-06-03 (×5): qty 1

## 2024-06-03 MED ORDER — LACTATED RINGERS IV SOLN: INTRAVENOUS | Status: DC | PRN

## 2024-06-03 MED ORDER — ACETAMINOPHEN 500 MG PO TABS
1000.0000 mg | ORAL_TABLET | Freq: Four times a day (QID) | ORAL | Status: DC
  Administered 2024-06-03 – 2024-06-05 (×6): 1000 mg via ORAL
  Filled 2024-06-03 (×6): qty 2

## 2024-06-03 MED ORDER — ONDANSETRON HCL 4 MG/2ML IJ SOLN
INTRAMUSCULAR | Status: DC | PRN
  Administered 2024-06-03: 4 mg via INTRAVENOUS

## 2024-06-03 MED ORDER — SODIUM CHLORIDE 0.9 % IV SOLN
2.0000 g | Freq: Two times a day (BID) | INTRAVENOUS | Status: AC
  Administered 2024-06-03: 2 g via INTRAVENOUS
  Filled 2024-06-03: qty 2

## 2024-06-03 MED ORDER — PROCHLORPERAZINE MALEATE 10 MG PO TABS: 10.0000 mg | ORAL_TABLET | Freq: Four times a day (QID) | ORAL | Status: DC | PRN

## 2024-06-03 MED ORDER — SODIUM CHLORIDE 0.9 % IV SOLN: 250.0000 mL | INTRAVENOUS | Status: DC | PRN

## 2024-06-03 MED ORDER — ACETAMINOPHEN 500 MG PO TABS
1000.0000 mg | ORAL_TABLET | ORAL | Status: AC
  Administered 2024-06-03: 1000 mg via ORAL
  Filled 2024-06-03: qty 2

## 2024-06-03 MED ORDER — FENTANYL CITRATE (PF) 250 MCG/5ML IJ SOLN
INTRAMUSCULAR | Status: DC | PRN
  Administered 2024-06-03: 50 ug via INTRAVENOUS
  Administered 2024-06-03: 100 ug via INTRAVENOUS
  Administered 2024-06-03 (×2): 50 ug via INTRAVENOUS

## 2024-06-03 MED ORDER — SODIUM CHLORIDE 0.9 % IR SOLN
Status: DC | PRN
  Administered 2024-06-03: 1000 mL

## 2024-06-03 MED ORDER — SIMETHICONE 80 MG PO CHEW: 40.0000 mg | CHEWABLE_TABLET | Freq: Four times a day (QID) | ORAL | Status: DC | PRN

## 2024-06-03 MED ORDER — NAPHAZOLINE-GLYCERIN 0.012-0.25 % OP SOLN: 1.0000 [drp] | Freq: Four times a day (QID) | OPHTHALMIC | Status: DC | PRN

## 2024-06-03 MED ORDER — HYDROMORPHONE HCL 1 MG/ML IJ SOLN
INTRAMUSCULAR | Status: AC
  Filled 2024-06-03: qty 1

## 2024-06-03 MED ORDER — OXYCODONE HCL 5 MG PO TABS: 5.0000 mg | ORAL_TABLET | Freq: Once | ORAL | Status: DC | PRN

## 2024-06-03 MED ORDER — GABAPENTIN 100 MG PO CAPS
200.0000 mg | ORAL_CAPSULE | ORAL | Status: AC
  Administered 2024-06-03: 200 mg via ORAL
  Filled 2024-06-03: qty 2

## 2024-06-03 MED ORDER — GLYCOPYRROLATE 0.2 MG/ML IJ SOLN
INTRAMUSCULAR | Status: AC
  Filled 2024-06-03: qty 1

## 2024-06-03 MED ORDER — MAGIC MOUTHWASH: 15.0000 mL | Freq: Four times a day (QID) | ORAL | Status: DC | PRN

## 2024-06-03 MED ORDER — ONDANSETRON HCL 4 MG/2ML IJ SOLN: 4.0000 mg | Freq: Four times a day (QID) | INTRAMUSCULAR | Status: DC | PRN

## 2024-06-03 MED ORDER — ENOXAPARIN SODIUM 40 MG/0.4ML IJ SOSY
40.0000 mg | PREFILLED_SYRINGE | INTRAMUSCULAR | Status: DC
  Administered 2024-06-04 – 2024-06-08 (×5): 40 mg via SUBCUTANEOUS
  Filled 2024-06-03 (×5): qty 0.4

## 2024-06-03 MED ORDER — ORAL CARE MOUTH RINSE: 15.0000 mL | Freq: Once | OROMUCOSAL | Status: AC

## 2024-06-03 MED ORDER — SODIUM CHLORIDE 0.9 % IV SOLN: 12.5000 mg | INTRAVENOUS | Status: DC | PRN

## 2024-06-03 MED ORDER — HYDRALAZINE HCL 20 MG/ML IJ SOLN: 10.0000 mg | INTRAMUSCULAR | Status: DC | PRN

## 2024-06-03 MED ORDER — LACTATED RINGERS IV SOLN: Freq: Three times a day (TID) | INTRAVENOUS | Status: AC | PRN

## 2024-06-03 MED ORDER — SODIUM CHLORIDE (PF) 0.9 % IJ SOLN
INTRAMUSCULAR | Status: AC
  Filled 2024-06-03: qty 10

## 2024-06-03 MED ORDER — HYDROMORPHONE HCL 1 MG/ML IJ SOLN
0.2500 mg | INTRAMUSCULAR | Status: DC | PRN
  Administered 2024-06-03: 0.5 mg via INTRAVENOUS

## 2024-06-03 MED ORDER — PANTOPRAZOLE SODIUM 40 MG PO TBEC
40.0000 mg | DELAYED_RELEASE_TABLET | Freq: Every day | ORAL | Status: DC
  Administered 2024-06-04 – 2024-06-08 (×5): 40 mg via ORAL
  Filled 2024-06-03 (×5): qty 1

## 2024-06-03 MED ORDER — KETAMINE HCL 50 MG/5ML IJ SOSY
PREFILLED_SYRINGE | INTRAMUSCULAR | Status: DC | PRN
  Administered 2024-06-03: 20 mg via INTRAVENOUS
  Administered 2024-06-03: 30 mg via INTRAVENOUS

## 2024-06-03 MED ORDER — PHENOL 1.4 % MT LIQD: 2.0000 | OROMUCOSAL | Status: DC | PRN

## 2024-06-03 MED ORDER — STERILE WATER FOR INJECTION IJ SOLN
INTRAMUSCULAR | Status: AC
  Filled 2024-06-03: qty 10

## 2024-06-03 MED ORDER — ALVIMOPAN 12 MG PO CAPS: 12.0000 mg | ORAL_CAPSULE | ORAL | Status: DC

## 2024-06-03 MED ORDER — SPY AGENT GREEN - (INDOCYANINE FOR INJECTION): INTRAMUSCULAR | Status: DC | PRN

## 2024-06-03 SURGICAL SUPPLY — 89 items
BAG COUNTER SPONGE SURGICOUNT (BAG) ×2 IMPLANT
BLADE EXTENDED COATED 6.5IN (ELECTRODE) IMPLANT
CANNULA REDUCER 12-8 DVNC XI (CANNULA) IMPLANT
CELLS DAT CNTRL 66122 CELL SVR (MISCELLANEOUS) IMPLANT
CHLORAPREP W/TINT 26 (MISCELLANEOUS) IMPLANT
CLIP APPLIE 5 13 M/L LIGAMAX5 (MISCELLANEOUS) IMPLANT
CLIP APPLIE ROT 10 11.4 M/L (STAPLE) IMPLANT
COVER SURGICAL LIGHT HANDLE (MISCELLANEOUS) ×4 IMPLANT
COVER TIP SHEARS 8 DVNC (MISCELLANEOUS) ×2 IMPLANT
DEFOGGER SCOPE WARM SEASHARP (MISCELLANEOUS) ×2 IMPLANT
DEVICE TROCAR PUNCTURE CLOSURE (ENDOMECHANICALS) IMPLANT
DRAIN CHANNEL 19F RND (DRAIN) IMPLANT
DRAPE ARM DVNC X/XI (DISPOSABLE) ×8 IMPLANT
DRAPE COLUMN DVNC XI (DISPOSABLE) ×2 IMPLANT
DRAPE CV SPLIT W-CLR ANES SCRN (DRAPES) ×2 IMPLANT
DRAPE PERI GROIN 82X75IN TIB (DRAPES) ×2 IMPLANT
DRAPE SURG IRRIG POUCH 19X23 (DRAPES) ×2 IMPLANT
DRIVER NDL LRG 8 DVNC XI (INSTRUMENTS) ×2 IMPLANT
DRIVER NDLE LRG 8 DVNC XI (INSTRUMENTS) ×2 IMPLANT
DRSG OPSITE POSTOP 4X10 (GAUZE/BANDAGES/DRESSINGS) IMPLANT
DRSG OPSITE POSTOP 4X6 (GAUZE/BANDAGES/DRESSINGS) IMPLANT
DRSG OPSITE POSTOP 4X8 (GAUZE/BANDAGES/DRESSINGS) IMPLANT
DRSG TEGADERM 2-3/8X2-3/4 SM (GAUZE/BANDAGES/DRESSINGS) ×10 IMPLANT
DRSG TEGADERM 4X4.75 (GAUZE/BANDAGES/DRESSINGS) IMPLANT
ELECT PENCIL ROCKER SW 15FT (MISCELLANEOUS) ×2 IMPLANT
ELECT REM PT RETURN 15FT ADLT (MISCELLANEOUS) ×2 IMPLANT
ENDOLOOP SUT PDS II 0 18 (SUTURE) IMPLANT
EVACUATOR SILICONE 100CC (DRAIN) IMPLANT
GAUZE PACKING 1/2INX5YD STRL (GAUZE/BANDAGES/DRESSINGS) IMPLANT
GAUZE SPONGE 2X2 8PLY STRL LF (GAUZE/BANDAGES/DRESSINGS) ×2 IMPLANT
GLOVE ECLIPSE 8.0 STRL XLNG CF (GLOVE) ×6 IMPLANT
GLOVE INDICATOR 8.0 STRL GRN (GLOVE) ×6 IMPLANT
GOWN SRG XL LVL 4 BRTHBL STRL (GOWNS) ×2 IMPLANT
GOWN STRL REUS W/ TWL XL LVL3 (GOWN DISPOSABLE) ×8 IMPLANT
GRASPER SUT TROCAR 14GX15 (MISCELLANEOUS) IMPLANT
GRASPER TIP-UP FEN DVNC XI (INSTRUMENTS) ×2 IMPLANT
HOLDER FOLEY CATH W/STRAP (MISCELLANEOUS) ×2 IMPLANT
IRRIGATION SUCT STRKRFLW 2 WTP (MISCELLANEOUS) ×2 IMPLANT
KIT PROCEDURE DVNC SI (MISCELLANEOUS) IMPLANT
KIT SIGMOIDOSCOPE (SET/KITS/TRAYS/PACK) IMPLANT
KIT TURNOVER KIT A (KITS) ×2 IMPLANT
NDL INSUFFLATION 14GA 120MM (NEEDLE) ×2 IMPLANT
NDL SPNL 20GX3.5 QUINCKE YW (NEEDLE) ×2 IMPLANT
NEEDLE INSUFFLATION 14GA 120MM (NEEDLE) ×2 IMPLANT
NEEDLE SPNL 20GX3.5 QUINCKE YW (NEEDLE) IMPLANT
PACK COLON (CUSTOM PROCEDURE TRAY) ×2 IMPLANT
PAD POSITIONING PINK XL (MISCELLANEOUS) ×2 IMPLANT
PROTECTOR NERVE ULNAR (MISCELLANEOUS) ×4 IMPLANT
RELOAD STAPLE 45 3.5 BLU DVNC (STAPLE) IMPLANT
RELOAD STAPLE 45 4.3 GRN DVNC (STAPLE) IMPLANT
RELOAD STAPLE 60 3.5 BLU DVNC (STAPLE) IMPLANT
RELOAD STAPLE 60 4.3 GRN DVNC (STAPLE) IMPLANT
RELOAD STAPLER 3.5X45 BLU DVNC (STAPLE) IMPLANT
RELOAD STAPLER 3.5X60 BLU DVNC (STAPLE) IMPLANT
RELOAD STAPLER 4.3X45 GRN DVNC (STAPLE) IMPLANT
RELOAD STAPLER 4.3X60 GRN DVNC (STAPLE) ×4 IMPLANT
RETRACTOR WND ALEXIS 18 MED (MISCELLANEOUS) IMPLANT
SCISSORS LAP 5X35 DISP (ENDOMECHANICALS) ×2 IMPLANT
SCISSORS MNPLR CVD DVNC XI (INSTRUMENTS) ×2 IMPLANT
SEAL UNIV 5-12 XI (MISCELLANEOUS) ×8 IMPLANT
SEALER VESSEL EXT DVNC XI (MISCELLANEOUS) ×2 IMPLANT
SOLUTION ELECTROSURG ANTI STCK (MISCELLANEOUS) ×2 IMPLANT
SPIKE FLUID TRANSFER (MISCELLANEOUS) ×2 IMPLANT
STAPLER 45 SUREFORM DVNC (STAPLE) IMPLANT
STAPLER 60 SUREFORM DVNC (STAPLE) IMPLANT
STAPLER ECHELON POWER CIR 29 (STAPLE) IMPLANT
STAPLER ECHELON POWER CIR 31 (STAPLE) IMPLANT
STOPCOCK 4 WAY LG BORE MALE ST (IV SETS) ×4 IMPLANT
SURGILUBE 2OZ TUBE FLIPTOP (MISCELLANEOUS) IMPLANT
SUT MNCRL AB 4-0 PS2 18 (SUTURE) ×2 IMPLANT
SUT PDS AB 1 CT1 27 (SUTURE) ×4 IMPLANT
SUT PROLENE 0 CT 2 (SUTURE) IMPLANT
SUT PROLENE 2 0 KS (SUTURE) IMPLANT
SUT PROLENE 2 0 SH DA (SUTURE) IMPLANT
SUT SILK 2 0 SH CR/8 (SUTURE) IMPLANT
SUT SILK 3 0 SH CR/8 (SUTURE) ×2 IMPLANT
SUT VIC AB 2-0 SH 18 (SUTURE) ×2 IMPLANT
SUT VIC AB 3-0 SH 18 (SUTURE) IMPLANT
SUT VIC AB 3-0 SH 27XBRD (SUTURE) IMPLANT
SUT VICRYL 0 UR6 27IN ABS (SUTURE) IMPLANT
SUTURE V-LC BRB 180 2/0GR6GS22 (SUTURE) IMPLANT
SYR 20ML ECCENTRIC (SYRINGE) ×2 IMPLANT
SYSTEM LAPSCP GELPORT 120MM (MISCELLANEOUS) IMPLANT
SYSTEM WOUND ALEXIS 18CM MED (MISCELLANEOUS) ×2 IMPLANT
TAPE UMBILICAL 1/8 X36 TWILL (MISCELLANEOUS) ×2 IMPLANT
TRAY FOLEY MTR SLVR 16FR STAT (SET/KITS/TRAYS/PACK) ×2 IMPLANT
TROCAR ADV FIXATION 5X100MM (TROCAR) ×2 IMPLANT
TUBING CONNECTING 10 (TUBING) ×4 IMPLANT
TUBING INSUFFLATION 10FT LAP (TUBING) ×2 IMPLANT

## 2024-06-03 NOTE — Anesthesia Procedure Notes (Signed)
 Procedure Name: Intubation Date/Time: 06/03/2024 8:51 AM  Performed by: Landy Chip HERO, CRNAPre-anesthesia Checklist: Patient identified, Emergency Drugs available, Suction available and Patient being monitored Patient Re-evaluated:Patient Re-evaluated prior to induction Oxygen Delivery Method: Circle System Utilized Preoxygenation: Pre-oxygenation with 100% oxygen Induction Type: IV induction Ventilation: Mask ventilation without difficulty Laryngoscope Size: Mac and 4 Grade View: Grade II Tube type: Oral Tube size: 7.0 mm Number of attempts: 1 Airway Equipment and Method: Stylet Placement Confirmation: ETT inserted through vocal cords under direct vision, positive ETCO2 and breath sounds checked- equal and bilateral Secured at: 21 cm Tube secured with: Tape Dental Injury: Teeth and Oropharynx as per pre-operative assessment

## 2024-06-03 NOTE — Op Note (Signed)
 Operative Note  Preoperative diagnosis:  1. History of sigmoid colon perforation   Postoperative diagnosis: Same  Procedure(s): 1.  Cystoscopy with bilateral ureteral FireFly injections  Surgeon: Lonni Han, MD  Assistants:  None   Anesthesia:  General  Complications:  None  EBL:  <5 mL  Specimens: 1. None  Drains/Catheters: 1.  16 French Foley  Intraoperative findings:   No intravesical pathology was seen on cystoscopy  Indication:  The patient is a 68 year old female with a history of sigmoid colon perforation in 2022 requiring a partial colectomy and colostomy.  She is here today for colostomy take down and reanastomosis with Dr. Sheldon.  Urology has been consulted to performed cystoscopy with bilateral ureteral Fire Fly injection to aide in intraoperative ureteral identification. The patient has been consented for the above procedures, voices understanding and wishes to proceed.  The patient has been consented for the above procedures, voices understanding and wishes to procede  Description of procedure: After informed consent was obtained, the patient was brought to the operating room and general endotracheal anesthesia was administered. The patient was then placed in the dorsolithotomy position and prepped and draped in usual sterile fashion. A timeout was performed. A 21 French rigid cystoscope was then inserted into the urethral meatus and advanced into the bladder under direct vision. A complete bladder survey revealed no intravesical pathology.   A 6 [place] open-ended catheter was then used to intubate the right ureteral orifice and a total of 7.5 mL of firefly solution diluted with 10 mL of saline was injected into the right collecting system. A similar maneuver was then carried out on the left with the same volume and concentration of firefly. The rigid cystoscope was then removed under direct vision. A 16 French Foley catheter was then inserted and placed to  gravity drainage. The patient tolerated the procedure well. Dr. Sheldon then proceeded with their portion of the case  Plan:  Foley removal is at the discretion of the primary team.

## 2024-06-03 NOTE — H&P (Signed)
 Urology Consult   Physician requesting consult: Elspeth Schultze, MD   Reason for consult: Ureteral firefly injections  History of Present Illness: Glenda Hill is a 68 y.o. female with a history of colon perforation in 2022 and is here today for colostomy takedown with Dr. Schultze.  Urology was consulted to perform cystoscopy with ureteral firefly injections to aid in ureteral identification intra-op.    The patient denies a history of voiding or storage urinary symptoms, hematuria, UTIs, STDs, urolithiasis, GU malignancy/trauma/surgery.  Past Medical History:  Diagnosis Date   Colostomy in place [facility])    Complication of anesthesia    trouble waking up after C section   COPD (chronic obstructive pulmonary disease) (HCC)    Diverticulitis    Ductal carcinoma in situ (DCIS) of left breast 2024   GERD (gastroesophageal reflux disease)    Hypertension     Past Surgical History:  Procedure Laterality Date   BACK SURGERY     x2 2014 and 2018   BREAST BIOPSY Left 06/03/2023   MM LT BREAST BX W LOC DEV 1ST LESION IMAGE BX SPEC STEREO GUIDE 06/03/2023 [facility]   BREAST BIOPSY  07/02/2023   MM LT RADIOACTIVE SEED LOC MAMMO GUIDE 07/02/2023 [facility]   BREAST LUMPECTOMY WITH RADIOACTIVE SEED LOCALIZATION Left 07/04/2023   Procedure: LEFT BREAST LUMPECTOMY WITH RADIOACTIVE SEED LOCALIZATION;  Surgeon: Vanderbilt Ned, MD;  Location: [facility] OR;  Service: General;  Laterality: Left;   CESAREAN SECTION     COLECTOMY  09/2021   COLONOSCOPY N/A 05/28/2024   Procedure: COLONOSCOPY;  Surgeon: Cinderella Deatrice FALCON, MD;  Location: [facility];  Service: Endoscopy;  Laterality: N/A;  1245pm, asa 1-2   TONSILLECTOMY     removed as a child    Current Hospital Medications:  Home Meds:  Current Meds  Medication Sig   amLODipine  (NORVASC ) 10 MG tablet Take 10 mg by mouth daily.   carvedilol  (COREG ) 12.5 MG tablet Take 12.5 mg by mouth 2 (two) times daily.   levocetirizine (XYZAL) 5 MG  tablet Take 5 mg by mouth daily.   losartan  (COZAAR ) 100 MG tablet Take 100 mg by mouth daily.   pantoprazole  (PROTONIX ) 40 MG tablet Take 40 mg by mouth daily.   tamoxifen  (NOLVADEX ) 10 MG tablet Take 1 tablet (10 mg total) by mouth daily.   TRELEGY ELLIPTA 100-62.5-25 MCG/ACT AEPB Inhale 1 puff into the lungs daily.    Scheduled Meds:  bupivacaine  liposome  20 mL Infiltration Once   Continuous Infusions:  cefoTEtan  (CEFOTAN ) IV     lactated ringers      lactated ringers  10 mL/hr at 06/03/24 0706   PRN Meds:.  Allergies: No Known Allergies  Family History  Problem Relation Age of Onset   Heart disease Mother    Lung cancer Mother 79       smoking hx   Emphysema Mother        smoking hx   Ovarian cancer Sister        dx 60-50   Cervical cancer Paternal Grandmother        dx unknown age   Arthritis Other    Lung disease Other    Diabetes Other    Cancer Cousin 82       stomach and lung; pat female cousin    Social History:  reports that she has been smoking cigarettes. She started smoking about 51 years ago. She has a 51.5 pack-year smoking history. She has never used smokeless tobacco.  She reports that she does not currently use alcohol. She reports that she does not use drugs.  ROS: A complete review of systems was performed.  All systems are negative except for pertinent findings as noted.  Physical Exam:  Vital signs in last 24 hours: Temp:  [98.3 F (36.8 C)] 98.3 F (36.8 C) (07/16 0644) Pulse Rate:  [65] 65 (07/16 0644) Resp:  [16] 16 (07/16 0644) BP: (126)/(76) 126/76 (07/16 0644) SpO2:  [97 %] 97 % (07/16 0644) Weight:  [84 kg] 84 kg (07/16 0648) Constitutional:  Alert and oriented, No acute distress Cardiovascular: Regular rate and rhythm, No JVD Respiratory: Normal respiratory effort, Lungs clear bilaterally GI: Abdomen is soft, nontender, nondistended, no abdominal masses GU: No CVA tenderness Lymphatic: No lymphadenopathy Neurologic: Grossly intact,  no focal deficits Psychiatric: Normal mood and affect  Laboratory Data:  No results for input(s): WBC, HGB, HCT, PLT in the last 72 hours.  No results for input(s): NA, K, CL, GLUCOSE, BUN, CALCIUM , CREATININE in the last 72 hours.  Invalid input(s): CO3   No results found for this or any previous visit (from the past 24 hours). No results found for this or any previous visit (from the past 240 hours).  Renal Function: No results for input(s): CREATININE in the last 168 hours. Estimated Creatinine Clearance: 70.1 mL/min (by C-G formula based on SCr of 0.88 mg/dL).  Radiologic Imaging: No results found.  I independently reviewed the above imaging studies.  Impression/Recommendation 54 y F with a history of sigmoid colon perforation who is here today for colostomy takedown and reanastomosis with Dr. Sheldon  -The risks, benefits and alternatives of cystoscopy with bilateral ureteral firefly injections was discussed.  She voices understanding and wishes to proceed.   Lonni Han, MD [facility] 06/03/2024, 7:58 AM

## 2024-06-03 NOTE — H&P (Signed)
 06/03/2024   REFERRING PHYSICIAN: Rosan Standing  Patient Care Team: Rosamond Leta NOVAK, MD as PCP - General (Internal Medicine) Eartha Flavors, Toribio, MD (Gastroenterology) Sheldon, Elspeth Bitter, MD as Consulting Provider (General Surgery)  PROVIDER: ELSPETH BITTER SHELDON, MD  [facility] MRN: [id number] DOB: 1956/09/07  SUBJECTIVE   Chief Complaint: NEW PROBLEM   Glenda Hill is a 68 y.o. female  who is seen today as an office consultation  at the request of Dr.. Hoffman  for evaluation of perforated colon diverticulitis with colostomy  History of Present Illness:  Patient returns. She did not get surgery because she was diagnosed with breast cancer and required multidisciplinary treatment. She is gone over that. She never got a colonoscopy. Sounds like she had bad colds and canceled and never rescheduled. She feels more bulging around her colostomy and sometimes it is hard to keep the bag on. She is worried she is get hernia now. Recalls being told that on a CAT scan. She has history close to back once or twice a day. She continues to smoke but claims she can walk 2030 minutes okay. Not on oxygen. She did have a cardiac workup that showed a mildly decreased ejection fraction in 2017. No cardiac follow-up since. She has been diagnosed with COPD but is not oxygen dependent. Just on inhaler. Used to see Dr. Darlean. She does not know her pulmonologist is the right now. She comes again with a close friend who has been begging her to quit smoking as well. Her mother was diagnosed with lung cancer and was a smoker.  PRIOR VISIT 05/06/2023 68 year old woman that presented with diverticulitis in Nov 2022 to the OR by Dr. Maranda. Prior the patient did struggle at home for a week before her sister recommended that she come in. He could not resect the area and just did washout and brought up a diverting loop colostomy transverse. Looks like he took him back a week later to try and resect and was not  successful. She had a midline wound along with abscesses but seems to have been infection free by spring 2023. She is now a year and a half out from that. She has COPD with some smoking. Was interested in trying to get rid of the colostomy. Family wish to have treatment in [place] so referral made to our group. She comes today with her sister who is very close with her.  Patient usually empties her bag about twice a day. No more attacks of diverticulitis that she is aware of. She occasionally will notice a little drainage out her rectum once or twice a month. She has never had a colonoscopy. She was told she may have some emphysema. She smokes about a pack a day. She can walk about 20 minutes before having to stop. Denies any history of any heart attacks or stroke. No knee oxygen. No COPD. She had a C-section in 1981 but no other abdominal surgeries. No diabetes that she is aware of. Some mild heartburn reflux. Hypertension controlled with Coreg  and hydrochlorothiazide .  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)  Tobacco abuse  Parastomal hernia without obstruction or gangrene  Family history of lung cancer  Decreased cardiac ejection fraction  Stricture of sigmoid colon (CMS/HHS-HCC)   Past Surgical History:  Procedure Laterality Date  colostomy bag  MASTECTOMY PARTIAL / LUMPECTOMY  SPINE SURGERY    No Known Allergies  Current Outpatient Medications on File Prior to Visit  Medication Sig Dispense Refill  amLODIPine  (NORVASC ) 10 MG tablet Take 10 mg by mouth once daily  bisacodyL  (DULCOLAX) 5 mg EC tablet Take 4 tablets (20 mg total) by mouth once daily as needed for Constipation for up to 1 dose 4 tablet 0  carvediloL  (COREG ) 12.5 MG  tablet Take 12.5 mg by mouth 2 (two) times daily  ergocalciferol , vitamin D2, 1,250 mcg (50,000 unit) capsule Take 50,000 Units by mouth once a week  losartan  (COZAAR ) 100 MG tablet Take 100 mg by mouth once daily  pantoprazole  (PROTONIX ) 40 MG DR tablet Take 40 mg by mouth once daily  tamoxifen  (NOLVADEX ) 10 MG tablet Take 10 mg by mouth once daily  TRELEGY ELLIPTA 100-62.5-25 mcg inhaler Inhale 1 Puff into the lungs once daily  candesartan (ATACAND) 16 MG tablet Take 16 mg by mouth once daily (Patient not taking: Reported on 03/09/2024)   No current facility-administered medications on file prior to visit.   Family History  Problem Relation Age of Onset  High blood pressure (Hypertension) Mother  Diabetes Father    Social History   Tobacco Use  Smoking Status Every Day  Current packs/day: 1.00  Types: Cigarettes  Smokeless Tobacco Never    Social History   Socioeconomic History  Marital status: Divorced  Tobacco Use  Smoking status: Every Day  Current packs/day: 1.00  Types: Cigarettes  Smokeless tobacco: Never  Vaping Use  Vaping status: Never Used  Substance and Sexual Activity  Alcohol use: Yes  Drug use: Never   Social Drivers of Health   Food Insecurity: No Food Insecurity (08/01/2023)  [facility] from [facility]  Hunger Vital Sign  Worried About Running Out of Food in the Last Year: Never true  Ran Out of Food in the Last Year: Never true  Transportation Needs: No Transportation Needs (08/01/2023)  Received from [facility] - Transportation  Lack of Transportation (Medical): No  Lack of Transportation (Non-Medical): No   ############################################################  Review of Systems: A complete review of systems (ROS) was obtained from the patient.  We have reviewed this information and discussed as appropriate with the patient.  See HPI as well for other pertinent ROS.  Constitutional: No fevers, chills, sweats. Weight  stable Eyes: No vision changes, No discharge HENT: No sore throats, nasal drainage Lymph: No neck swelling, No bruising easily Pulmonary: No cough, productive sputum CV: No orthopnea, PND . No exertional chest/neck/shoulder/arm pain. Patient can walk 20 minutes without difficulty.   GI: No personal nor family history of GI/colon cancer, inflammatory bowel disease, irritable bowel syndrome, allergy such as Celiac Sprue, dietary/dairy problems, colitis, ulcers nor gastritis. No recent sick contacts/gastroenteritis. No travel outside the country. No changes in diet.  Renal: No UTIs, No hematuria Genital: No drainage, bleeding, masses Musculoskeletal: No severe joint pain. Good ROM major joints Skin: No sores or lesions Heme/Lymph: No easy bleeding. No swollen lymph nodes Neuro: No active seizures. No facial droop Psych: No hallucinations. No agitation  OBJECTIVE   Vitals:  03/09/24 1441 03/09/24 1442  Weight: 82.6 kg (182 lb)  PainSc: 4  PainLoc: Abdomen   Body mass index is 28.51 kg/m.  PHYSICAL EXAM:  Constitutional: Not cachectic. Hygeine adequate. Vitals signs as above.  Eyes: Wears glasses - vision corrected,Pupils reactive, normal extraocular movements. Sclera nonicteric Neuro: CN II-XII intact. No major focal sensory  defects. No major motor deficits. Lymph: No head/neck/groin lymphadenopathy Psych: No severe agitation. No severe anxiety. Judgment & insight Adequate, Oriented x4, HENT: Normocephalic, Mucus membranes moist. No thrush. Hearing: adequate Neck: Supple, No tracheal deviation. No obvious thyromegaly Chest: No pain to chest wall compression. Good respiratory excursion. No audible wheezing CV: Pulses intact. regular. No major extremity edema Ext: No obvious deformity or contracture. Edema: Not present. No cyanosis Skin: No major subcutaneous nodules. Warm and dry Musculoskeletal: Severe joint rigidity not present. No obvious clubbing. No digital petechiae.  Mobility: no assist device moving easily without restrictions  Abdomen: Soft. Nondistended. Nontender. Hernia: Bulging lateral to the colostomy with sensitivity is consistent with small bowel within a parastomal hernia . Periumbilical midline incision well-healed without any obvious hernia. Left upper quadrant flat pink colostomy in place with Play-Doh consistency stool in bag major parastomal hernia. Diastasis recti: Mild supraumbilical midline. No hepatomegaly. No splenomegaly.  Genital/Pelvic: Inguinal hernia: Not present. Inguinal lymph nodes: without lymphadenopathy nor hidradenitis.   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 - metroNIDAZOLE  (FLAGYL ) 500 MG tablet; Take 2 tablets (1,000 mg total) by mouth 3 (three) times daily for 3 doses SEE BOWEL PREP INSTRUCTIONS: Take 2 tablets at 2pm, 3pm, and 10pm the day prior to your colon operation. - polyethylene glycol (MIRALAX ) powder; Take 233.75 g by mouth once for 1 dose Take according to your procedure prep instructions. - bisacodyL  (DULCOLAX) 5 mg EC tablet; Take 4 tablets (20 mg total) by  mouth once daily as needed for Constipation for up to 1 dose - ondansetron  (ZOFRAN ) 4 MG tablet; Take 1 tablet (4 mg total) by mouth every 8 (eight) hours as needed for Nausea    ASSESSMENT/PLAN  Pleasant active smoking woman requiring diverting transverse colostomy in the setting of massive sigmoid perforation and contamination in fall 2022.  I again recommended surgery like I did last year. Things are understandably delayed given her breast cancer diagnosis. Looks like she is through all that and gotten over some bad strep throat/sinusitis/colds/flus and is now ready to proceed surgery. Reasonable to see if we can do rectosigmoid resection with colostomy takedown. Primarily repair the parastomal hernia. Would not recommend mesh at the same time as colon surgery. Would start with sigmoid colectomy. See if I can have to do a left hemicolectomy can do a transverse to rectal anastomosis vs resection with colostomy takedown. 2 anastomoses would be slightly higher risk and not as ideal but would like to preserve colon length if we can. Risk of leak relatively low on the colostomy takedown since it is well-perfused even if she is a smoker.  Try to avoid diverting loop ileostomy but may be required.  We will see  The anatomy & physiology of the digestive tract was discussed. The pathophysiology was discussed. Possibility of remaining with an ostomy permanently was discussed. I offered ostomy takedown. Laparoscopic & open techniques were discussed.   Risks such as bleeding, infection, abscess, leak, reoperation, possible re-ostomy, injury to other organs, need for repair of tissues / organs, need for further treatment, hernia, heart attack, death, and other risks were discussed. I noted a good likelihood this will help address the problem. Goals of post-operative recovery were discussed as well. We will work to minimize complications. Questions were answered. The patient expresses understanding & wishes to  proceed with surgery.  Patient got colonoscopy last week which showed no malignancy or other concerns and colostomy patent.  Probable stricture between distal end of diverting loop colostomy and rectum consistent with diverticulitis with stricturing  She is a smoker and has okay activity level.  Seen by cardiology with Myoview  with shows normal ejection fraction.  Low to intermediate risk study but cleared.  Elspeth KYM Schultze, MD, FACS, MASCRS Esophageal, Gastrointestinal & Colorectal Surgery Robotic and Minimally Invasive Surgery  [facility] [address] [place], [place] [address] [phone] Fax [phone] Main  CONTACT INFORMATION: Weekday (9AM-5PM): Call [facility] main office at [phone] Weeknight (5PM-9AM) or Weekend/Holiday: Check EPIC Web Links tab & use AMION (password  [email or web address]) for General Surgery [facility] coverage  Please, DO NOT use SecureChat  (it is not reliable communication to reach operating surgeons & will lead to a delay in care).   Epic staff messaging available for outptient concerns needing 1-2 business day response.

## 2024-06-03 NOTE — Transfer of Care (Signed)
 Immediate Anesthesia Transfer of Care Note  Patient: Glenda Hill  Procedure(s) Performed: CYSTOSCOPY WITH INDOCYANINE Doye Montilla  IMAGING (ICG) CLOSURE, COLOSTOMY, ROBOT-ASSISTED (Abdomen) COLECTOMY, SIGMOID, ROBOT-ASSISTED (Abdomen) LYSIS, ADHESIONS, ROBOT-ASSISTED, LAPAROSCOPIC (Abdomen) SIGMOIDOSCOPY, FLEXIBLE  Patient Location: PACU  Anesthesia Type:General  Level of Consciousness: sedated  Airway & Oxygen Therapy: Patient Spontanous Breathing and Patient connected to face mask oxygen  Post-op Assessment: Report given to RN and Post -op Vital signs reviewed and stable  Post vital signs: Reviewed and stable  Last Vitals:  Vitals Value Taken Time  BP 151/80 06/03/24 12:31  Temp    Pulse 60 06/03/24 12:36  Resp 16 06/03/24 12:36  SpO2 100 % 06/03/24 12:36  Vitals shown include unfiled device data.  Last Pain:  Vitals:   06/03/24 0648  TempSrc:   PainSc: 0-No pain         Complications: No notable events documented.

## 2024-06-03 NOTE — Discharge Instructions (Addendum)
 SURGERY: POST OP INSTRUCTIONS (Surgery for small bowel obstruction, colon resection, etc)   ######################################################################  EAT Gradually transition to a high fiber diet with a fiber supplement over the next few days after discharge  WALK Walk an hour a day.  Control your pain to do that.    CONTROL PAIN Control pain so that you can walk, sleep, tolerate sneezing/coughing, go up/down stairs.  HAVE A BOWEL MOVEMENT DAILY Keep your bowels regular to avoid problems.  OK to try a laxative to override constipation.  OK to use an antidairrheal to slow down diarrhea.  Call if not better after 2 tries  CALL IF YOU HAVE PROBLEMS/CONCERNS Call if you are still struggling despite following these instructions. Call if you have concerns not answered by these instructions  ######################################################################   DIET Follow a light diet the first few days at home.  Start with a bland diet such as soups, liquids, starchy foods, low fat foods, etc.  If you feel full, bloated, or constipated, stay on a ful liquid or pureed/blenderized diet for a few days until you feel better and no longer constipated. Be sure to drink plenty of fluids every day to avoid getting dehydrated (feeling dizzy, not urinating, etc.). Gradually add a fiber supplement to your diet over the next week.  Gradually get back to a regular solid diet.  Avoid fast food or heavy meals the first week as you are more likely to get nauseated. It is expected for your digestive tract to need a few months to get back to normal.  It is common for your bowel movements and stools to be irregular.  You will have occasional bloating and cramping that should eventually fade away.  Until you are eating solid food normally, off all pain medications, and back to regular activities; your bowels will not be normal. Focus on eating a low-fat, high fiber diet the rest of your life  (See Getting to Good Bowel Health, below).  CARE of your INCISION or WOUND  It is good for closed incisions and even open wounds to be washed every day.  Shower every day.  Short baths are fine.  Wash the incisions and wounds clean with soap & water .    You may leave closed incisions open to air if it is dry.   You may cover the incision with clean gauze & replace it after your daily shower for comfort.  TEGADERM & WICKS:  You have clear gauze band-aid dressings over your closed incision(s).  Remove the dressings & shoelace ribbon wicks in your largest incision 2 days after surgery = 7/18 Friday.    If you have an open wound with a wound vac, see wound vac care instructions.    ACTIVITIES as tolerated Start light daily activities --- self-care, walking, climbing stairs-- beginning the day after surgery.  Gradually increase activities as tolerated.  Control your pain to be active.  Stop when you are tired.  Ideally, walk several times a day, eventually an hour a day.   Most people are back to most day-to-day activities in a few weeks.  It takes 4-8 weeks to get back to unrestricted, intense activity. If you can walk 30 minutes without difficulty, it is safe to try more intense activity such as jogging, treadmill, bicycling, low-impact aerobics, swimming, etc. Save the most intensive and strenuous activity for last (Usually 4-8 weeks after surgery) such as sit-ups, heavy lifting, contact sports, etc.  Refrain from any intense heavy lifting or straining until you  are off narcotics for pain control.  You will have off days, but things should improve week-by-week. DO NOT PUSH THROUGH PAIN.  Let pain be your guide: If it hurts to do something, don't do it.  Pain is your body warning you to avoid that activity for another week until the pain goes down. You may drive when you are no longer taking narcotic prescription pain medication, you can comfortably wear a seatbelt, and you can safely make sudden  turns/stops to protect yourself without hesitating due to pain. You may have sexual intercourse when it is comfortable. If it hurts to do something, stop.   MEDICATIONS Take your usually prescribed home medications unless otherwise directed.    Blood thinners:  You can restart any strong blood thinners after the second postoperative day  for example: COUMADIN (warfarin), XERELTO (rivaroxaban), ELIQUIS (apixaban), PLAVIX (clopidigrel), BRILINTA (ticagrelor), EFFIENT (prasugrel), PRADAXA (dabigatran), etc  Continue aspirin before & after surgery..     Some oozing/bleeding the first 1-2 weeks is common but should taper down & be small volume.    If you are passing many large clots or having uncontrolling bleeding, call your surgeon    PAIN CONTROL Pain after surgery or related to activity is often due to strain/injury to muscle, tendon, nerves and/or incisions.  This pain is usually short-term and will improve in a few months.  To help speed the process of healing and to get back to regular activity more quickly, DO THE FOLLOWING THINGS TOGETHER: Increase activity gradually.  DO NOT PUSH THROUGH PAIN Use Ice and/or Heat Try Gentle Massage and/or Stretching Take over the counter pain medication Take Narcotic prescription pain medication for more severe pain  Good pain control = faster recovery.  It is better to take more medicine to be more active than to stay in bed all day to avoid medications.  Increase activity gradually Avoid heavy lifting at first, then increase to lifting as tolerated over the next 6 weeks. Do not "push through" the pain.  Listen to your body and avoid positions and maneuvers than reproduce the pain.  Wait a few days before trying something more intense Walking an hour a day is encouraged to help your body recover faster and more safely.  Start slowly and stop when getting sore.  If you can walk 30 minutes without stopping or pain, you can try more intense activity  (running, jogging, aerobics, cycling, swimming, treadmill, sex, sports, weightlifting, etc.) Remember: If it hurts to do it, then don't do it! Use Ice and/or Heat You will have swelling and bruising around the incisions.  This will take several weeks to resolve. Ice packs or heating pads (6-8 times a day, 30-60 minutes at a time) will help sooth soreness & bruising. Some people prefer to use ice alone, heat alone, or alternate between ice & heat.  Experiment and see what works best for you.  Consider trying ice for the first few days to help decrease swelling and bruising; then, switch to heat to help relax sore spots and speed recovery. Shower every day.  Short baths are fine.  It feels good!  Keep the incisions and wounds clean with soap & water .   Try Gentle Massage and/or Stretching Massage at the area of pain many times a day Stop if you feel pain - do not overdo it Take over the counter pain medication This helps the muscle and nerve tissues become less irritable and calm down faster Choose ONE of the following over-the-counter  anti-inflammatory medications: Acetaminophen  500mg  tabs (Tylenol ) 1-2 pills with every meal and just before bedtime (avoid if you have liver problems or if you have acetaminophen  in you narcotic prescription) Naproxen  220mg  tabs (ex. Aleve , Naprosyn ) 1-2 pills twice a day (avoid if you have kidney, stomach, IBD, or bleeding problems) Ibuprofen 200mg  tabs (ex. Advil, Motrin) 3-4 pills with every meal and just before bedtime (avoid if you have kidney, stomach, IBD, or bleeding problems) Take with food/snack several times a day as directed for at least 2 weeks to help keep pain / soreness down & more manageable. Take Narcotic prescription pain medication for more severe pain A prescription for strong pain control is often given to you upon discharge (for example: oxycodone /Percocet, hydrocodone /Norco/Vicodin, or tramadol /Ultram ) Take your pain medication as  prescribed. Be mindful that most narcotic prescriptions contain Tylenol  (acetaminophen ) as well - avoid taking too much Tylenol . If you are having problems/concerns with the prescription medicine (does not control pain, nausea, vomiting, rash, itching, etc.), please call us  [phone] to see if we need to switch you to a different pain medicine that will work better for you and/or control your side effects better. If you need a refill on your pain medication, you must call the office before 4 pm and on weekdays only.  By federal law, prescriptions for narcotics cannot be called into a pharmacy.  They must be filled out on paper & picked up from our office by the patient or authorized caretaker.  Prescriptions cannot be filled after 4 pm nor on weekends.    WHEN TO CALL US  ([phone] Severe uncontrolled or worsening pain  Fever over 101 F (38.5 C) Concerns with the incision: Worsening pain, redness, rash/hives, swelling, bleeding, or drainage Reactions / problems with new medications (itching, rash, hives, nausea, etc.) Nausea and/or vomiting Difficulty urinating Difficulty breathing Worsening fatigue, dizziness, lightheadedness, blurred vision Other concerns If you are not getting better after two weeks or are noticing you are getting worse, contact our office [phone] for further advice.  We may need to adjust your medications, re-evaluate you in the office, send you to the emergency room, or see what other things we can do to help. The clinic staff is available to answer your questions during regular business hours (8:30am-5pm).  Please don't hesitate to call and ask to speak to one of our nurses for clinical concerns.    A surgeon from [facility] is always on call at the hospitals 24 hours/day If you have a medical emergency, go to the nearest emergency room or call 911.  FOLLOW UP in our office One the day of your discharge from the hospital (or the next business  weekday), please call [facility] to set up or confirm an appointment to see your surgeon in the office for a follow-up appointment.  Usually it is 2-3 weeks after your surgery.   If you have skin staples at your incision(s), let the office know so we can set up a time in the office for the nurse to remove them (usually around 10 days after surgery). Make sure that you call for appointments the day of discharge (or the next business weekday) from the hospital to ensure a convenient appointment time. IF YOU HAVE DISABILITY OR FAMILY LEAVE FORMS, BRING THEM TO THE OFFICE FOR PROCESSING.  DO NOT GIVE THEM TO YOUR DOCTOR.  [facility] [address], [place], [place]  [address] ? [phone] - Main [phone] -  Toll Free,  [phone] - Fax [email or web address]    GETTING TO GOOD BOWEL HEALTH. It is expected for your digestive tract to need a few months to get back to normal.  It is common for your bowel movements and stools to be irregular.  You will have occasional bloating and cramping that should eventually fade away.  Until you are eating solid food normally, off all pain medications, and back to regular activities; your bowels will not be normal.   Avoiding constipation The goal: ONE SOFT BOWEL MOVEMENT A DAY!    Drink plenty of fluids.  Choose water  first. TAKE A FIBER SUPPLEMENT EVERY DAY THE REST OF YOUR LIFE During your first week back home, gradually add back a fiber supplement every day Experiment which form you can tolerate.   There are many forms such as powders, tablets, wafers, gummies, etc Psyllium bran (Metamucil), methylcellulose (Citrucel), Miralax  or Glycolax , Benefiber, Flax Seed.  Adjust the dose week-by-week (1/2 dose/day to 6 doses a day) until you are moving your bowels 1-2 times a day.  Cut back the dose or try a different fiber product if it is giving you problems such as diarrhea or bloating. Sometimes a  laxative is needed to help jump-start bowels if constipated until the fiber supplement can help regulate your bowels.  If you are tolerating eating & you are farting, it is okay to try a gentle laxative such as double dose MiraLax , prune juice, or Milk of Magnesia.  Avoid using laxatives too often. Stool softeners can sometimes help counteract the constipating effects of narcotic pain medicines.  It can also cause diarrhea, so avoid using for too long. If you are still constipated despite taking fiber daily, eating solids, and a few doses of laxatives, call our office. Controlling diarrhea Try drinking liquids and eating bland foods for a few days to avoid stressing your intestines further. Avoid dairy products (especially milk & ice cream) for a short time.  The intestines often can lose the ability to digest lactose when stressed. Avoid foods that cause gassiness or bloating.  Typical foods include beans and other legumes, cabbage, broccoli, and dairy foods.  Avoid greasy, spicy, fast foods.  Every person has some sensitivity to other foods, so listen to your body and avoid those foods that trigger problems for you. Probiotics (such as active yogurt, Align, etc) may help repopulate the intestines and colon with normal bacteria and calm down a sensitive digestive tract Adding a fiber supplement gradually can help thicken stools by absorbing excess fluid and retrain the intestines to act more normally.  Slowly increase the dose over a few weeks.  Too much fiber too soon can backfire and cause cramping & bloating. It is okay to try and slow down diarrhea with a few doses of antidiarrheal medicines.   Bismuth subsalicylate (ex. Kayopectate, Pepto Bismol) for a few doses can help control diarrhea.  Avoid if pregnant.   Loperamide (Imodium) can slow down diarrhea.  Start with one tablet (2mg ) first.  Avoid if you are having fevers or severe pain.  ILEOSTOMY PATIENTS WILL HAVE CHRONIC DIARRHEA since their  colon is not in use.    Drink plenty of liquids.  You will need to drink even more glasses of water /liquid a day to avoid getting dehydrated. Record output from your ileostomy.  Expect to empty the bag every 3-4 hours at first.  Most people with a permanent ileostomy empty their bag 4-6 times at the least.   Use antidiarrheal  medicine (especially Imodium) several times a day to avoid getting dehydrated.  Start with a dose at bedtime & breakfast.  Adjust up or down as needed.  Increase antidiarrheal medications as directed to avoid emptying the bag more than 8 times a day (every 3 hours). Work with your wound ostomy nurse to learn care for your ostomy.  See ostomy care instructions. TROUBLESHOOTING IRREGULAR BOWELS 1) Start with a soft & bland diet. No spicy, greasy, or fried foods.  2) Avoid gluten/wheat or dairy products from diet to see if symptoms improve. 3) Miralax  17gm or flax seed mixed in 8oz. water  or juice-daily. May use 2-4 times a day as needed. 4) Gas-X, Phazyme, etc. as needed for gas & bloating.  5) Prilosec (omeprazole) over-the-counter as needed 6)  Consider probiotics (Align, Activa, etc) to help calm the bowels down  Call your doctor if you are getting worse or not getting better.  Sometimes further testing (cultures, endoscopy, X-ray studies, CT scans, bloodwork, etc.) may be needed to help diagnose and treat the cause of the diarrhea. [facility] [address], [address], [place], [place]  [address] [phone] - Main.    [phone]  - Toll Free.   [phone] - Fax [email or web address]   STOP SMOKING!  We strongly recommend that you stop smoking.  Smoking increases the risk of surgery including infection in the form of an open wound, pus formation, abscess, hernia at an incision on the abdomen, etc.  You have an increased risk of other MAJOR complications such as stroke, heart attack, forming clots in the leg and/or lungs, and  death.    Smoking Cessation Quitting smoking is important to your health and has many advantages. However, it is not always easy to quit since nicotine  is a very addictive drug. Often times, people try 3 times or more before being able to quit. This document explains the best ways for you to prepare to quit smoking. Quitting takes hard work and a lot of effort, but you can do it. ADVANTAGES OF QUITTING SMOKING You will live longer, feel better, and live better. Your body will feel the impact of quitting smoking almost immediately. Within 20 minutes, blood pressure decreases. Your pulse returns to its normal level. After 8 hours, carbon monoxide levels in the blood return to normal. Your oxygen level increases. After 24 hours, the chance of having a heart attack starts to decrease. Your breath, hair, and body stop smelling like smoke. After 48 hours, damaged nerve endings begin to recover. Your sense of taste and smell improve. After 72 hours, the body is virtually free of nicotine . Your bronchial tubes relax and breathing becomes easier. After 2 to 12 weeks, lungs can hold more air. Exercise becomes easier and circulation improves. The risk of having a heart attack, stroke, cancer, or lung disease is greatly reduced. After 1 year, the risk of coronary heart disease is cut in half. After 5 years, the risk of stroke falls to the same as a nonsmoker. After 10 years, the risk of lung cancer is cut in half and the risk of other cancers decreases significantly. After 15 years, the risk of coronary heart disease drops, usually to the level of a nonsmoker. If you are pregnant, quitting smoking will improve your chances of having a healthy baby. The people you live with, especially any children, will be healthier. You will have extra money to spend on things other than cigarettes. QUESTIONS TO THINK ABOUT BEFORE ATTEMPTING TO  QUIT You may want to talk about your answers with your caregiver. Why do you  want to quit? If you tried to quit in the past, what helped and what did not? What will be the most difficult situations for you after you quit? How will you plan to handle them? Who can help you through the tough times? Your family? Friends? A caregiver? What pleasures do you get from smoking? What ways can you still get pleasure if you quit? Here are some questions to ask your caregiver: How can you help me to be successful at quitting? What medicine do you think would be best for me and how should I take it? What should I do if I need more help? What is smoking withdrawal like? How can I get information on withdrawal? GET READY Set a quit date. Change your environment by getting rid of all cigarettes, ashtrays, matches, and lighters in your home, car, or work. Do not let people smoke in your home. Review your past attempts to quit. Think about what worked and what did not. GET SUPPORT AND ENCOURAGEMENT You have a better chance of being successful if you have help. You can get support in many ways. Tell your family, friends, and co-workers that you are going to quit and need their support. Ask them not to smoke around you. Get individual, group, or telephone counseling and support. Programs are available at [employer] and health centers. Call your local health department for information about programs in your area. Spiritual beliefs and practices may help some smokers quit. Download a quit meter on your computer to keep track of quit statistics, such as how long you have gone without smoking, cigarettes not smoked, and money saved. Get a self-help book about quitting smoking and staying off of tobacco. LEARN NEW SKILLS AND BEHAVIORS Distract yourself from urges to smoke. Talk to someone, go for a walk, or occupy your time with a task. Change your normal routine. Take a different route to work. Drink tea instead of coffee. Eat breakfast in a different place. Reduce your stress. Take a  hot bath, exercise, or read a book. Plan something enjoyable to do every day. Reward yourself for not smoking. Explore interactive web-based programs that specialize in helping you quit. GET MEDICINE AND USE IT CORRECTLY Medicines can help you stop smoking and decrease the urge to smoke. Combining medicine with the above behavioral methods and support can greatly increase your chances of successfully quitting smoking. Nicotine  replacement therapy helps deliver nicotine  to your body without the negative effects and risks of smoking. Nicotine  replacement therapy includes nicotine  gum, lozenges, inhalers, nasal sprays, and skin patches. Some may be available over-the-counter and others require a prescription. Antidepressant medicine helps people abstain from smoking, but how this works is unknown. This medicine is available by prescription. Nicotinic receptor partial agonist medicine simulates the effect of nicotine  in your brain. This medicine is available by prescription. Ask your caregiver for advice about which medicines to use and how to use them based on your health history. Your caregiver will tell you what side effects to look out for if you choose to be on a medicine or therapy. Carefully read the information on the package. Do not use any other product containing nicotine  while using a nicotine  replacement product.  RELAPSE OR DIFFICULT SITUATIONS Most relapses occur within the first 3 months after quitting. Do not be discouraged if you start smoking again. Remember, most people try several times before finally quitting. You  may have symptoms of withdrawal because your body is used to nicotine . You may crave cigarettes, be irritable, feel very hungry, cough often, get headaches, or have difficulty concentrating. The withdrawal symptoms are only temporary. They are strongest when you first quit, but they will go away within 10 14 days. To reduce the chances of relapse, try to: Avoid drinking  alcohol. Drinking lowers your chances of successfully quitting. Reduce the amount of caffeine you consume. Once you quit smoking, the amount of caffeine in your body increases and can give you symptoms, such as a rapid heartbeat, sweating, and anxiety. Avoid smokers because they can make you want to smoke. Do not let weight gain distract you. Many smokers will gain weight when they quit, usually less than 10 pounds. Eat a healthy diet and stay active. You can always lose the weight gained after you quit. Find ways to improve your mood other than smoking. FOR MORE INFORMATION  www.smokefree.gov    While it can be one of the most difficult things to do, the [place] community has programs to help you stop.  Consider talking with your primary care physician about options.  Also, Smoking Cessation classes are available through the [facility]:  The smoking cessation program is a proven-effective program from the American Lung Association. The program is available for anyone 1 and older who currently smokes. The program lasts for 7 weeks and is 8 sessions. Each class will be approximately 1 1/2 hours. The program is every Tuesday.  All classes are 12-1:30pm and same location.  Event Location Information:  Location: [facility] 2nd Floor Conference Room 2-037; located next to [facility] cross streets: [address] [facility] Entrance into the [facility] is adjacent to the [facility] main entrance. The conference room is located on the 2nd floor.  Parking Instructions: Visitor parking is adjacent to [employer] main entrance and the Cancer Center   A smoking cessation program is also offered through the [facility]. Register online at [email or web address] or call [phone] for more information.  Tobacco cessation counseling is available at [facility]. Call [phone] for a free appointment.   Tobacco cessation classes also are available through the [facility] in [place]. For information, call [phone].  The Patient Education Network features videos on tobacco cessation. Please consult your listings in the center of this book to find instructions on how to access this resource.  If you want more information, ask your nurse.

## 2024-06-03 NOTE — Anesthesia Postprocedure Evaluation (Signed)
 Anesthesia Post Note  Patient: Glenda Hill  Procedure(s) Performed: CYSTOSCOPY WITH INDOCYANINE GREEN  IMAGING (ICG) CLOSURE, COLOSTOMY, ROBOT-ASSISTED (Abdomen) COLECTOMY, SIGMOID, ROBOT-ASSISTED (Abdomen) LYSIS, ADHESIONS, ROBOT-ASSISTED, LAPAROSCOPIC (Abdomen) SIGMOIDOSCOPY, FLEXIBLE     Patient location during evaluation: PACU Anesthesia Type: General Level of consciousness: awake and alert Pain management: pain level controlled Vital Signs Assessment: post-procedure vital signs reviewed and stable Respiratory status: spontaneous breathing, nonlabored ventilation and respiratory function stable Cardiovascular status: blood pressure returned to baseline and stable Postop Assessment: no apparent nausea or vomiting Anesthetic complications: no   No notable events documented.  Last Vitals:  Vitals:   06/03/24 1415 06/03/24 1430  BP: 136/89 (!) 146/86  Pulse: 68 70  Resp: 13 12  Temp:    SpO2: 94% 94%    Last Pain:  Vitals:   06/03/24 1430  TempSrc:   PainSc: 0-No pain                 Butler Levander Pinal

## 2024-06-03 NOTE — Op Note (Signed)
 06/03/2024  12:17 PM  PATIENT:  Glenda Hill  68 y.o. female  Patient Care Team: Rosamond Leta NOVAK, MD as PCP - General (Internal Medicine) Mallipeddi, Diannah SQUIBB, MD as PCP - Cardiology (Cardiology) Maranda Ellaree CROME, MD as Referring Physician (Surgery) Delford Maude BROCKS, MD as Consulting Physician (Cardiology) Darlean Ozell NOVAK, MD as Consulting Physician (Pulmonary Disease) Alix Charleston, MD as Attending Physician (Neurosurgery) Sheldon Standing, MD as Consulting Physician (Colon and Rectal Surgery) Odean Potts, MD as Consulting Physician (Hematology and Oncology) Vanderbilt Ned, MD as Consulting Physician (General Surgery) Dewey Rush, MD as Consulting Physician (Radiation Oncology) Cinderella Deatrice FALCON, MD as Consulting Physician (Gastroenterology) Devere Lonni Righter, MD as Consulting Physician (Urology)  PRE-OPERATIVE DIAGNOSIS:  COLOSTOMY FOR PERFORATED COLON DIVERTICULITIS, DESIRE FOR OSTOMY TAKEDOWN  POST-OPERATIVE DIAGNOSIS:  COLOSTOMY FOR PERFORATED COLON DIVERTICULITIS WITH ABSCESS, DESIRE FOR OSTOMY TAKEDOWN  PROCEDURE:   -ROBOTIC DISTAL LEFT COLECTOMY WITH TAKEDOWN OF LOOP TRANSVERSE COLOSTOMY WITH ANASTOMOSIS -ROBOTIC LYSIS OF ADHESIONS x90 MINUTES (55% OF CASE) -DRAINAGE OF PELVIC ABSCESS -MOBILIZATION OF SPLENIC FLEXURE OF COLON -INTRAOPERATIVE ASSESSMENT OF TISSUE VASCULAR PERFUSION USING ICG (indocyanine green ) IMMUNOFLUORESCENCE -TRANSVERSUS ABDOMINIS PLANE (TAP) BLOCK - BILATERAL -FLEXIBLE SIGMOIDOSCOPY  SURGEON:  Standing KYM Sheldon, MD  ASSISTANT:  Bernarda Ned, MD  An experienced assistant was required given the standard of surgical care given the complexity of the case.  This assistant was needed for exposure, dissection, suction, tissue approximation, retraction, perception, etc  ANESTHESIA:  General endotracheal intubation anesthesia (GETA) and Regional TRANSVERSUS ABDOMINIS PLANE (TAP) nerve block -BILATERAL for perioperative & postoperative pain  control at the level of the transverse abdominis & preperitoneal spaces along the flank at the anterior axillary line, from subcostal ridge to iliac crest under laparoscopic guidance provided with 60 mL of bupivicaine 0.25% with epinephrine   Estimated Blood Loss (EBL):   Total I/O In: 1350 [I.V.:1000; IV Piggyback:350] Out: 350 [Urine:300; Blood:50].   (See anesthesia record)  Delay start of Pharmacological VTE agent (>24hrs) due to concerns of significant anemia, surgical blood loss, or risk of bleeding?:  no  DRAINS: (None)  SPECIMEN:  Colon - distal left (including distal transverse colostomy)  and Distal anastomotic ring (FINAL DISTAL MARGIN)  DISPOSITION OF SPECIMEN:  Pathology  COUNTS:  Sponge, needle, & instrument counts CORRECT at the conclusion of the case.      PLAN OF CARE: Admit to inpatient   PATIENT DISPOSITION:  PACU - hemodynamically stable.  INDICATION: Pleasant patient with severe diverticulitis with perforation abscess status post attempted resection with conversion to diverting distal loop transverse colostomy.  Reattempt a week later without success.  Referral made to consider colostomy takedown.  Patient diagnosed with breast cancer and required treatment of that.  Has come through all that and is cleared by oncology as well as cardiology.  Colonoscopy showing no mass or tumor consistent with diverticulitis with stricture.  The patient has recovered and has understandably requested ostomy takedown.  Medically stabilized and felt reasonable to proceed.   I discussed the procedure with the patient:  The anatomy & physiology of the digestive tract was discussed.  The pathophysiology was discussed.  Possibility of remaining with an ostomy permanently was discussed.  I offered ostomy takedown.  Minimally invasive & open techniques were discussed.   Risks such as bleeding, infection, abscess, leak, reoperation, possible re-ostomy, injury to other organs, hernia, heart attack,  death, and other risks were discussed.   I noted a good likelihood this will help address the problem.  Goals of  post-operative recovery were discussed as well.  We will work to minimize complications.  Questions were answered.  The patient expresses understanding & wishes to proceed with surgery.  OR FINDINGS:  Thickened phlegmonous distal descending and all of sigmoid colon with dense concrete adhesion to left lateral pelvic rim containing small abscess.  Consistent with diverticulitis with chronic stricture.  Moderately dense especially omental adhesions to anterior abdominal wall and pelvis.  Distal transverse colon loop colostomy with proximally and more inferiorly.  Moderate parastomal hernia.  Few small Swiss cheese incisional hernias containing omentum only in the midline.  Small left ovarian simple cyst left alone.  Enlarged smooth uterus. No obvious metastatic disease on visceral parietal peritoneum or liver.  It is a 31mm EEA anastomosis ( distal transverse colon  connected to proximal rectum.)  It rests 13 cm from the anal verge by rigid proctoscopy.  CASE DATA: Type of patient?: Elective [facility] Private Case Status of Case? Elective Scheduled Infection Present At Time Of Surgery (PATOS)?  ABSCESS   DESCRIPTION:   Informed consent was confirmed.  The patient underwent general anaesthesia without difficulty.  The patient was positioned appropriately.  VTE prevention in place.  Patient underwent cystoscopy with ICG firefly and infiltration of bladder and ureters by Dr. Lonni Han with [facility].  Please see his separate of operative report.  No major concerns.  The patient's abdomen was clipped, prepped, & draped in a sterile fashion.  Surgical timeout confirmed our plan.  Peritoneal entry with a laparoscopic port was obtained using Varess spring needle entry technique in the left upper abdomen as the patient was positioned in reverse Trendelenburg.  I induced carbon dioxide  insufflation.  No change in end tidal CO2 measurements.  Full symmetrical abdominal distention.  Initial port was carefully placed.  Camera inspection revealed no injury.  Patient had extensive adhesions of omentum and small bowel to anterior abdominal wall.  Did some laparoscopic lysis of adhesions to allow the remaining ports to be  carefully placed under direct laparoscopic visualization.  Xi robot carefully docked & instruments placed.  We then continued with minimally invasive exploration.  I worked to freed adhesions to the anterior abdominal wall parietal peritoneum.  Primarily omentum.  Eventually freed off adhesions down in the pelvis as well to get the omentum off of the small bowel and pelvis.  Freed omental adhesions off the colostomy and visceral peritoneum.  Make sure to free all small bowel interloop adhesions and adhesions to the left retroperitoneum and colostomy mesentery and pelvis.  We inspected the small intestine to ensure there is no injury or other surprises.  Hemostasis was good.    We focused on dissection down in the pelvis.  Work to free adhesions and identify the rectal stump.  Patient had a smooth but enlarged uterus that had dense adhesions to the rectosigmoid region.  These were carefully freed off.  Freed off adnexal adhesions as well to come around anteriorly.  Tried to mobilize lateral to medial but had very dense concrete adhesions of the thickened sigmoid colon consistent with a chronic diverticulitis with stricture.  Worked to come from the retroperitoneum and immediately lateral approach.  Elevated the rectosigmoid colon and identified the main IMA/IMV pedicle.  Came through the peritoneum from the ligament of Treitz down to the right proximal rectum taking care to protect and spare the ureter.  Elevated the left colon mesentery off the retroperitoneum along with the mesorectum off the sacrum.  Could see identify the left ureter  and adnexa.  Freed the mesentery in a medial  and lateral fashion.  Skeletonized and transected the MI and IMV pedicles carefully.  Elevated the splenic flexure mesentery off the retroperitoneum especially the left kidney injury Rhodus up toward the inferior pancreatic ridge.  Did meticulous care to free the inflamed rectosigmoid mesentery off the left ureter and gonadal vessels carefully.  I then worked to come around laterally and anteriorly.  Patient had a very hard concrete disc of tissue densely adherent to the left lateral pelvis just lateral to the left ureter.  Switched over to scissors and meticulously came through with cautery to encounter a small concrete abscess with a chronic rupture.  This was evacuated and aspirated.  Left button of that abscess cavity along the gonadal vessels and left ureter to avoid injury.  Worked distally to come into the proximal mesorectum.  Came around anteriorly in the rectosigmoid and proximal rectum had thickened dense adhesions and kinking with thinned out areas.  Side, little more distally until I had a soft rectum more in the proximal/mid rectal junction.  I did anorectal examination esthesia and used finger and blunt dilators up the rectal stump to ensure no stricture kinking or twisting.  33 EEA sizer could pass up to the rectosigmoid junction consistent with good mobility no further distal stricture.  Went back and robotically skeletonized and transected through the proximal mesorectum circumferentially.  Because of the distal transverse colostomy felt it was safest to resect the remaining left colon and splenic flexure since it looks like there was enough mobility of the transverse colon to reach down to the pelvis.  Transected the greater omentum off of the transverse colon and toward to the splenic flexure.  Able to get into the lesser sac and free the lesser omentum off as well.  This helped to identify the superficial aspects of splenic flexure of the colon.  I then alternated between mobilization from  the proximal descending colon off the retroperitoneum and left kidney Gerota's fascia/fat pad with coming down to the inferior peripancreatic ridge.  Came through the visceral peritoneum of the distal transverse colon mesentery and connected with my prior retroperitoneal dissection on the transverse colon and splenic flexure.  With that I continued freeing off the splenic flexure mesentery off the retroperitoneum just inferior to the pancreas transversely, came around the corner, avoiding injury to the pancreas spleen and kidney.  Freed off circumferential attachments of the proximal and distal loops of transverse colon up into the anterior abdominal wall and got into a parastomal hernia sac.  Got good dissection to free off the loop colostomy from much of the anterior abdominal wall fascia.  Straightened out the bend with that got good mobilization of the left colon and splenic flexure to better have the colon reach down into the pelvis.  Completed transecting the descending and splenic flexure with be colon mesentery until connected with the diverting colostomy.  Transected at the distal transverse colon mesentery in a radial fashion taking care to preserve as much of the left middle colic pedicle as possible.  Freed the transverse colon attachments to the retroperitoneum and towards the midline to get good mobility.  Hemostasis look good.  Tissues appeared viable without any injury.    To assess vascular perfusion of tissues, we asked anesthesia use intravenous  indocyanine green  (ICG) with IV flush.  I switched to the NIR fluorescence (Firefly mode) imaging window on the daVinci robot platform.  We were able to see good light  green visualization of blood vessels with good vascular perfusion of tissues, confirming good tissue perfusion of tissues (distal transverse colon and proximal rectum) planned for anastomosis.  Transected at the proximal rectum using a 60 mm green load stapler x 2 firings.  (90% across  with first load)  We undocked the robot.  We made an incision around the ostomy.  I got into the subcutaneous tissues.  I used careful focused right angle dissection and sharp dissection.  Some focused cautery dissection as well.  That helped to free adhesions to the subcutaneous tisses & fascia.  I was able to enter into the peritoneum focally.  I did a gentle finger sweep.  Gradually came around circumferentially and freed the bowel from remaining adhesions to the abdominal wall.  We were able eviscerate the ostomy to do inspection and assured viability and hemostasis.  I chose as distal the location that was viable for the proximal end of the anastomosis.  I clamped the colon at the point of resection using a reusable pursestringer device.  Passed a 2-0 Keith needle. I transected at the descending/sigmoid junction with a scalpel. I got healthy bleeding mucosa.  We sent the rectosigmoid colon specimen off to go to pathology.  We sized the colon orifice.   I chose a 31mm EEA anvil stapler system.  I reinforced the prolene pursestring with interrupted silk belt loop sutures.  I placed the anvil to the open end of the proximal remaining colon and closed around it using the pursestring.   Returned the viscera back into the abdomen and did inspection of the abdomen.  We did copious irrigation with crystalloid solution.  Hemostasis was good.  The distal end of the colon at the handle easily reached down to the rectal stump.  I scrubbed down and did gentle anal dilation and advanced the EEA stapler up the rectal stump. The spike was brought out at the provimal end of the rectal stump under direct visualization.  Dr Debby attached the anvil of the proximal colon the spike of the stapler. Anvil was tightened down and held clamped for 60 seconds.  Orientation was confirmed such that there is no twisting of the colon nor small bowel underneath the mesenteric defect. No concerning tension.  The EEA stapler was fired  and held clamped for 30 seconds. The stapler was released & removed. Blue stitch is in the proximal ring.  Care was taken to ensure no other structures were incorporated within this either.  We noted 2 excellent anastomotic rings.   The colon proximal to the anastomosis was then gently occluded. The pelvis was filled with sterile irrigation.  I did flexible sigmoidoscopy.  Noted the anastomosis was at 13 cm from the anal verge consistent with the proximal rectum.  Intact anastomosis without active bleeding.  No mucosal ischemia.  There was a negative air leak test. There was no tension of mesentery or bowel at the anastomosis.   Tissues looked viable.  Ureters & bowel uninjured.  The anastomosis looked healthy.  Great omentum had a few defects that I closed with some interrupted silk sutures until we had a nice open flat omentum to back down in the pelvis.  Greater omentum positioned down into the pelvis to help protect the anastomosis.  Ports and will protector and instruments removed.  We changed gown and gloves.  The patient was re-draped.  Sterile unused instruments were used from this point out per colon SSI prevention protocol.  I closed the 5mm port  sites using Monocryl stitch and sterile dressing.  I closed the fascia of the abdominal wall ostomy wound vertically using running #1 PDS.  The skin of the colostomy came to get there better obliquely so I used 2-0 Vicryl deep dermal interrupted sutures and transected the corners to avoid dogear limping.  I placed antibiotic-soaked wicks into the closure at the corners x2. I placed a sterile dressings  Patient is being extubated go to recovery room. I discussed postop care with the patient in detail the office & in the holding area. Instructions are written. I discussed operative findings, updated the patient's status, discussed probable steps to recovery, and gave postoperative recommendations to the patient's family, Glenda Hill.  Recommendations were  made.  Questions were answered.  She expressed understanding & appreciation.   Glenda Hill, M.D., F.A.C.S. Gastrointestinal and Minimally Invasive Surgery [facility] [address], [address] [place], [place] [address] [phone] Main / Paging

## 2024-06-03 NOTE — Interval H&P Note (Signed)
 History and Physical Interval Note:  06/03/2024 7:27 AM  Glenda Hill  has presented today for surgery, with the diagnosis of COLOSTOMY FOR COLON PERFORATION DESIRE FOR OSTOMY TAKEDOWN.  The various methods of treatment have been discussed with the patient and family. After consideration of risks, benefits and other options for treatment, the patient has consented to  Procedure(s) with comments: CYSTOSCOPY WITH INDOCYANINE GREEN  IMAGING (ICG) (N/A) CLOSURE, COLOSTOMY, ROBOT-ASSISTED (N/A) - ROBOTIC OSTOMY TAKEDOWN COLECTOMY, SIGMOID, ROBOT-ASSISTED (N/A) - COLECTOMY LYSIS, ADHESIONS, ROBOT-ASSISTED, LAPAROSCOPIC (N/A) - LYSIS OF ADHESIONS SIGMOIDOSCOPY, FLEXIBLE (N/A) as a surgical intervention.  The patient's history has been reviewed, patient examined, no change in status, stable for surgery.  I have reviewed the patient's chart and labs.  Questions were answered to the patient's satisfaction.    I have re-reviewed the the patient's records, history, medications, and allergies.  I have re-examined the patient.  I again discussed intraoperative plans and goals of post-operative recovery.  The patient agrees to proceed.  Glenda Hill  07-18-1956 [id number]  Patient Care Team: Glenda Leta NOVAK, MD as PCP - General (Internal Medicine) Glenda Diannah SQUIBB, MD as PCP - Cardiology (Cardiology) Glenda Ellaree CROME, MD as Referring Physician (Surgery) Glenda Maude BROCKS, MD as Consulting Physician (Cardiology) Glenda Ozell NOVAK, MD as Consulting Physician (Pulmonary Disease) Glenda Charleston, MD as Attending Physician (Neurosurgery) Glenda Standing, MD as Consulting Physician (Colon and Rectal Surgery) Glenda Potts, MD as Consulting Physician (Hematology and Oncology) Glenda Ned, MD as Consulting Physician (General Surgery) Dewey Rush, MD as Consulting Physician (Radiation Oncology) Cinderella Deatrice FALCON, MD as Consulting Physician (Gastroenterology)  Patient Active Problem List   Diagnosis  Date Noted   Cecal polyp 05/28/2024   Smoking addiction 03/17/2024   Diverticulitis of colon 03/17/2024   Essential hypertension 03/17/2024   NSAID long-term use 03/17/2024   Preop cardiovascular exam 07/16/2023   Family history of ovarian cancer 07/03/2023   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   Lumbar stenosis 05/27/2017   Upper airway cough syndrome 01/16/2016   Cigarette smoker 01/16/2016   Tibial plateau fracture 05/15/2012    Past Medical History:  Diagnosis Date   Colostomy in place [facility])    Complication of anesthesia    trouble waking up after C section   COPD (chronic obstructive pulmonary disease) (HCC)    Diverticulitis    Ductal carcinoma in situ (DCIS) of left breast 2024   GERD (gastroesophageal reflux disease)    Hypertension     Past Surgical History:  Procedure Laterality Date   BACK SURGERY     x2 2014 and 2018   BREAST BIOPSY Left 06/03/2023   MM LT BREAST BX W LOC DEV 1ST LESION IMAGE BX SPEC STEREO GUIDE 06/03/2023 [facility]   BREAST BIOPSY  07/02/2023   MM LT RADIOACTIVE SEED LOC MAMMO GUIDE 07/02/2023 [facility]   BREAST LUMPECTOMY WITH RADIOACTIVE SEED LOCALIZATION Left 07/04/2023   Procedure: LEFT BREAST LUMPECTOMY WITH RADIOACTIVE SEED LOCALIZATION;  Surgeon: Glenda Ned, MD;  Location: [facility] OR;  Service: General;  Laterality: Left;   CESAREAN SECTION     COLECTOMY  09/2021   COLONOSCOPY N/A 05/28/2024   Procedure: COLONOSCOPY;  Surgeon: Cinderella Deatrice FALCON, MD;  Location: [facility];  Service: Endoscopy;  Laterality: N/A;  1245pm, asa 1-2   TONSILLECTOMY     removed as a child    Social History   Socioeconomic History   Marital status: Significant Other    Spouse name: Not  on file   Number of children: 1   Years of education: 73   Highest education level: Not on file  Occupational History   Not on file  Tobacco Use   Smoking status: Every Day    Current packs/day: 1.00    Average packs/day: 1  pack/day for 51.5 years (51.5 ttl pk-yrs)    Types: Cigarettes    Start date: 59   Smokeless tobacco: Never   Tobacco comments:    stopped for a while   Vaping Use   Vaping status: Never Used  Substance and Sexual Activity   Alcohol use: Not Currently   Drug use: No   Sexual activity: Not on file  Other Topics Concern   Not on file  Social History Narrative   Not on file   Social Drivers of Health   Financial Resource Strain: Not on file  Food Insecurity: No Food Insecurity (08/01/2023)   Hunger Vital Sign    Worried About Running Out of Food in the Last Year: Never true    Ran Out of Food in the Last Year: Never true  Transportation Needs: No Transportation Needs (08/01/2023)   PRAPARE - Administrator, Civil Service (Medical): No    Lack of Transportation (Non-Medical): No  Physical Activity: Not on file  Stress: Not on file  Social Connections: Not on file  Intimate Partner Violence: Not At Risk (08/01/2023)   Humiliation, Afraid, Rape, and Kick questionnaire    Fear of Current or Ex-Partner: No    Emotionally Abused: No    Physically Abused: No    Sexually Abused: No    Family History  Problem Relation Age of Onset   Heart disease Mother    Lung cancer Mother 9       smoking hx   Emphysema Mother        smoking hx   Ovarian cancer Sister        dx 30-50   Cervical cancer Paternal Grandmother        dx unknown age   Arthritis Other    Lung disease Other    Diabetes Other    Cancer Cousin 60       stomach and lung; pat female cousin    Medications Prior to Admission  Medication Sig Dispense Refill Last Dose/Taking   amLODipine  (NORVASC ) 10 MG tablet Take 10 mg by mouth daily.   06/03/2024 at  4:20 AM   carvedilol  (COREG ) 12.5 MG tablet Take 12.5 mg by mouth 2 (two) times daily.   06/03/2024 Morning   levocetirizine (XYZAL) 5 MG tablet Take 5 mg by mouth daily.   06/03/2024 at  4:20 AM   losartan  (COZAAR ) 100 MG tablet Take 100 mg by mouth daily.    06/02/2024   pantoprazole  (PROTONIX ) 40 MG tablet Take 40 mg by mouth daily.   06/03/2024 at  4:20 AM   tamoxifen  (NOLVADEX ) 10 MG tablet Take 1 tablet (10 mg total) by mouth daily. 90 tablet 3 06/03/2024 at  4:20 AM   TRELEGY ELLIPTA 100-62.5-25 MCG/ACT AEPB Inhale 1 puff into the lungs daily.   06/02/2024   Calcium  Carb-Cholecalciferol  600-20 MG-MCG TABS Take 2 tablets by mouth daily.   05/27/2024   ibuprofen (ADVIL,MOTRIN) 200 MG tablet Take 400 mg by mouth every 6 (six) hours as needed for headache (pain).   05/27/2024   meloxicam (MOBIC) 7.5 MG tablet Take 7.5 mg by mouth daily.   05/27/2024   Multiple Vitamin (MULTIVITAMIN WITH  MINERALS) TABS tablet Take 1 tablet by mouth daily. Woman   05/27/2024   Vitamin D , Ergocalciferol , (DRISDOL ) 1.25 MG (50000 UNIT) CAPS capsule Take 50,000 Units by mouth once a week.   05/27/2024    Current Facility-Administered Medications  Medication Dose Route Frequency Provider Last Rate Last Admin   bupivacaine  liposome (EXPAREL ) 1.3 % injection 266 mg  20 mL Infiltration Once Glenda Standing, MD       cefoTEtan  (CEFOTAN ) 2 g in sodium chloride  0.9 % 100 mL IVPB  2 g Intravenous On Call to OR Glenda Standing, MD       lactated ringers  infusion   Intravenous Continuous Keneth Lynwood POUR, MD       lactated ringers  infusion   Intravenous Continuous Kendell Yvonna PARAS, MD 10 mL/hr at 06/03/24 0706 New Bag at 06/03/24 0706     No Known Allergies  BP 126/76   Pulse 65   Temp 98.3 F (36.8 C) (Oral)   Resp 16   Ht 5' 7 (1.702 m)   Wt 84 kg   SpO2 97%   BMI 29.00 kg/m   Labs: No results found for this or any previous visit (from the past 48 hours).  Imaging / Studies: No results found.   Briant KYM Glenda, M.D., F.A.C.S. Gastrointestinal and Minimally Invasive Surgery [facility] [address] [place], [place] [address] [phone] Main / Paging  06/03/2024 7:27 AM   Hill JAYSON Glenda

## 2024-06-04 ENCOUNTER — Ambulatory Visit (INDEPENDENT_AMBULATORY_CARE_PROVIDER_SITE_OTHER): Payer: Self-pay | Admitting: Gastroenterology

## 2024-06-04 ENCOUNTER — Encounter (HOSPITAL_COMMUNITY): Payer: Self-pay | Admitting: Urology

## 2024-06-04 DIAGNOSIS — E876 Hypokalemia: Secondary | ICD-10-CM

## 2024-06-04 LAB — BASIC METABOLIC PANEL WITH GFR
Anion gap: 9 (ref 5–15)
BUN: 8 mg/dL (ref 8–23)
CO2: 26 mmol/L (ref 22–32)
Calcium: 8.4 mg/dL — ABNORMAL LOW (ref 8.9–10.3)
Chloride: 102 mmol/L (ref 98–111)
Creatinine, Ser: 0.92 mg/dL (ref 0.44–1.00)
GFR, Estimated: >60 mL/min (ref >60)
Glucose, Bld: 128 mg/dL — ABNORMAL HIGH (ref 70–99)
Potassium: 3.2 mmol/L — ABNORMAL LOW (ref 3.5–5.1)
Sodium: 137 mmol/L (ref 135–145)

## 2024-06-04 LAB — CBC
HCT: 38.2 % (ref 36.0–46.0)
Hemoglobin: 12.4 g/dL (ref 12.0–15.0)
MCH: 31.1 pg (ref 26.0–34.0)
MCHC: 32.5 g/dL (ref 30.0–36.0)
MCV: 95.7 fL (ref 80.0–100.0)
Platelets: 231 K/uL (ref 150–400)
RBC: 3.99 MIL/uL (ref 3.87–5.11)
RDW: 13 % (ref 11.5–15.5)
WBC: 9.9 K/uL (ref 4.0–10.5)
nRBC: 0 % (ref 0.0–0.2)

## 2024-06-04 LAB — HEMOGLOBIN A1C
Hgb A1c MFr Bld: 5.2 % (ref 4.8–5.6)
Mean Plasma Glucose: 103 mg/dL

## 2024-06-04 LAB — MAGNESIUM: Magnesium: 1.6 mg/dL — ABNORMAL LOW (ref 1.7–2.4)

## 2024-06-04 LAB — PREALBUMIN: Prealbumin: 16 mg/dL — ABNORMAL LOW (ref 18–38)

## 2024-06-04 LAB — PHOSPHORUS: Phosphorus: 3.4 mg/dL (ref 2.5–4.6)

## 2024-06-04 MED ORDER — MAGNESIUM SULFATE 4 GM/100ML IV SOLN
4.0000 g | Freq: Once | INTRAVENOUS | Status: AC
  Administered 2024-06-04: 4 g via INTRAVENOUS
  Filled 2024-06-04: qty 100

## 2024-06-04 MED ORDER — POTASSIUM CHLORIDE CRYS ER 20 MEQ PO TBCR
40.0000 meq | EXTENDED_RELEASE_TABLET | Freq: Two times a day (BID) | ORAL | Status: AC
  Administered 2024-06-04 – 2024-06-06 (×6): 40 meq via ORAL
  Filled 2024-06-04 (×6): qty 2

## 2024-06-04 MED ORDER — NICOTINE 14 MG/24HR TD PT24: 14.0000 mg | MEDICATED_PATCH | TRANSDERMAL | 2 refills | Status: DC

## 2024-06-04 NOTE — Plan of Care (Signed)
  Problem: Education: Goal: Understanding of discharge needs will improve 06/04/2024 0352 by Epifanio Venetia BIRCH, RN Outcome: Progressing 06/04/2024 0352 by Epifanio Venetia BIRCH, RN Outcome: Progressing Goal: Verbalization of understanding of the causes of altered bowel function will improve 06/04/2024 0352 by Epifanio Venetia BIRCH, RN Outcome: Progressing 06/04/2024 0352 by Epifanio Venetia BIRCH, RN Outcome: Progressing   Problem: Activity: Goal: Ability to tolerate increased activity will improve 06/04/2024 0352 by Epifanio Venetia BIRCH, RN Outcome: Progressing 06/04/2024 0352 by Epifanio Venetia BIRCH, RN Outcome: Progressing   Problem: Bowel/Gastric: Goal: Gastrointestinal status for postoperative course will improve 06/04/2024 0352 by Epifanio Venetia BIRCH, RN Outcome: Progressing 06/04/2024 0352 by Epifanio Venetia BIRCH, RN Outcome: Progressing

## 2024-06-04 NOTE — Progress Notes (Signed)
   06/04/24 0936  TOC Brief Assessment  Insurance and Status Reviewed  Patient has primary care physician Yes  Home environment has been reviewed home with s/o  Prior level of function: independent  Prior/Current Home Services No current home services  Social Drivers of Health Review SDOH reviewed no interventions necessary  Readmission risk has been reviewed Yes  Transition of care needs no transition of care needs at this time

## 2024-06-04 NOTE — Progress Notes (Signed)
 06/04/2024  Glenda Hill [id number] 1956/06/11  CARE TEAM: PCP: Rosamond Leta NOVAK, MD  Outpatient Care Team: Patient Care Team: Rosamond Leta NOVAK, MD as PCP - General (Internal Medicine) Mallipeddi, Diannah SQUIBB, MD as PCP - Cardiology (Cardiology) Maranda Ellaree CROME, MD as Referring Physician (Surgery) Delford Maude BROCKS, MD as Consulting Physician (Cardiology) Darlean Ozell NOVAK, MD as Consulting Physician (Pulmonary Disease) Alix Charleston, MD as Attending Physician (Neurosurgery) Sheldon Standing, MD as Consulting Physician (Colon and Rectal Surgery) Odean Potts, MD as Consulting Physician (Hematology and Oncology) Vanderbilt Ned, MD as Consulting Physician (General Surgery) Dewey Rush, MD as Consulting Physician (Radiation Oncology) Cinderella Deatrice FALCON, MD as Consulting Physician (Gastroenterology) Devere Lonni Righter, MD as Consulting Physician (Urology)  Inpatient Treatment Team: Treatment Team:  Sheldon Standing, MD Devere Lonni Righter, MD Epifanio Venetia BIRCH, RN   Problem List:   Principal Problem:   Stricture of sigmoid colon [facility]) Active Problems:   Tobacco abuse   Diverticulitis of colon with perforation   Essential hypertension   Colostomy in place [facility])   COPD (chronic obstructive pulmonary disease) (HCC)   Anxiety   06/03/2024  POST-OPERATIVE DIAGNOSIS:  COLOSTOMY FOR PERFORATED COLON DIVERTICULITIS WITH ABSCESS, DESIRE FOR OSTOMY TAKEDOWN   PROCEDURE:   -ROBOTIC DISTAL LEFT COLECTOMY WITH TAKEDOWN OF LOOP TRANSVERSE COLOSTOMY WITH ANASTOMOSIS -ROBOTIC LYSIS OF ADHESIONS x90 MINUTES (55% OF CASE) -DRAINAGE OF PELVIC ABSCESS -MOBILIZATION OF SPLENIC FLEXURE OF COLON -INTRAOPERATIVE ASSESSMENT OF TISSUE VASCULAR PERFUSION USING ICG (indocyanine green ) IMMUNOFLUORESCENCE -TRANSVERSUS ABDOMINIS PLANE (TAP) BLOCK - BILATERAL -FLEXIBLE SIGMOIDOSCOPY   SURGEON:  Standing KYM Sheldon, MD  OR FINDINGS:  Thickened phlegmonous distal descending and all of sigmoid colon  with dense concrete adhesion to left lateral pelvic rim containing small abscess.  Consistent with diverticulitis with chronic stricture.  Moderately dense especially omental adhesions to anterior abdominal wall and pelvis.  Distal transverse colon loop colostomy with proximally and more inferiorly.  Moderate parastomal hernia.  Few small Swiss cheese incisional hernias containing omentum only in the midline.  Small left ovarian simple cyst left alone.  Enlarged smooth uterus. No obvious metastatic disease on visceral parietal peritoneum or liver.   It is a 31mm EEA anastomosis ( distal transverse colon  connected to proximal rectum.)  It rests 13 cm from the anal verge by rigid proctoscopy.   CASE DATA: Type of patient?: Elective [facility] Private Case Status of Case? Elective Scheduled Infection Present At Time Of Surgery (PATOS)?  ABSCESS    Assessment [facility] Stay = 1 days) 1 Day Post-Op    Recovering relatively well so far    Plan:  ERAS protocol.  Follow-up on pathology.  Seems most consistent with diverticulitis with chronic stricture and small abscess  Stop IV fluids with back up  Foley catheter removed.  -monitor electrolytes & replace as needed .  Some hypokalemia and hypomagnesemia.  Double check phosphorus as well. Keep K>4, Mg>2, Phos>3  -Hypertension.  Back on carvedilol  amlodipine .  Lower threshold to hold angiotensin II inhibitor as needed.  Smoking cessation with nicotine  patch.  Encouraged her to follow-up with primary for different smoking cessation program since Wellbutrin did not seem to help.  She is interested in nicotine  patches.  -VTE prophylaxis- SCDs.  Anticoagulation prophyllaxis SQ as appropriate  -mobilize as tolerated to help recovery.  Enlist therapies in moderate/high risk patients as appropriate  I updated the patient's status to the patient and nurse  Recommendations were made.  Questions were answered.  They expressed understanding &  appreciation.  -  Disposition:  Disposition:  The patient is from: Home Anticipate discharge to:  Home Anticipated Date of Discharge is:  July 18,2025   Barriers to discharge:  Pending Clinical improvement (more likely than not)  Patient currently is NOT MEDICALLY STABLE for discharge from the hospital from a surgery standpoint.      I reviewed last 24 h vitals and pain scores, last 48 h intake and output, last 24 h labs and trends, and last 24 h imaging results.  I have reviewed this patient's available data, including medical history, events of note, test results, etc as part of my evaluation.   A significant portion of that time was spent in counseling. Care during the described time interval was provided by me.  This care required moderate level of medical decision making.  06/04/2024    Subjective: (Chief complaint)  Patient tolerating liquids.  Nurse in room.  Already got up to walk in hallways.  Denies much soreness.  Objective:  Vital signs:  Vitals:   06/03/24 1742 06/03/24 2038 06/04/24 0143 06/04/24 0453  BP: (!) 147/84 (!) 163/87 128/77 121/71  Pulse: 87 84 80 73  Resp: 16 18 18 18   Temp: 97.7 F (36.5 C) 98 F (36.7 C) 98.2 F (36.8 C) 98.4 F (36.9 C)  TempSrc:  Oral Oral Oral  SpO2: 93% 93% 90% 91%  Weight:      Height:        Last BM Date : 06/03/24  Intake/Output   Yesterday:  07/16 0701 - 07/17 0700 In: 3545.4 [P.O.:720; I.V.:2475.4; IV Piggyback:350] Out: 4500 [Urine:4450; Blood:50] This shift:  Total I/O In: 1139.7 [P.O.:480; I.V.:659.7] Out: 2150 [Urine:2150]  Bowel function:  Flatus: YES  BM:  No  Drain: (No drain)   Physical Exam:  General: Pt awake/alert in no acute distress Eyes: PERRL, normal EOM.  Sclera clear.  No icterus Neuro: CN II-XII intact w/o focal sensory/motor deficits. Lymph: No head/neck/groin lymphadenopathy Psych:  No delerium/psychosis/paranoia.  Oriented x 4 HENT: Normocephalic, Mucus membranes  moist.  No thrush Neck: Supple, No tracheal deviation.  No obvious thyromegaly Chest: No pain to chest wall compression.  Good respiratory excursion.  No audible wheezing CV:  Pulses intact.  Regular rhythm.  No major extremity edema MS: Normal AROM mjr joints.  No obvious deformity  Abdomen: Soft.  Nondistended.  Nontender.  No evidence of peritonitis.  Dressings clean dry and intact.  No incarcerated hernias.  Ext:   No deformity.  No mjr edema.  No cyanosis Skin: No petechiae / purpurea.  No major sores.  Warm and dry    Results:   Cultures: No results found for this or any previous visit (from the past 720 hours).  Labs: Results for orders placed or performed during the hospital encounter of 06/03/24 (from the past 48 hours)  Hemoglobin A1c     Status: None   Collection Time: 06/03/24 12:48 PM  Result Value Ref Range   Hgb A1c MFr Bld 5.2 4.8 - 5.6 %    Comment: (NOTE)         Prediabetes: 5.7 - 6.4         Diabetes: >6.4         Glycemic control for adults with diabetes: <7.0    Mean Plasma Glucose 103 mg/dL    Comment: (NOTE) Performed At: [facility] [address] [place], [place] [id number] Jennette Shorter MD Ey:[id number]   Basic metabolic panel     Status: Abnormal   Collection Time:  06/04/24  5:03 AM  Result Value Ref Range   Sodium 137 135 - 145 mmol/L   Potassium 3.2 (L) 3.5 - 5.1 mmol/L   Chloride 102 98 - 111 mmol/L   CO2 26 22 - 32 mmol/L   Glucose, Bld 128 (H) 70 - 99 mg/dL    Comment: Glucose reference range applies only to samples taken after fasting for at least 8 hours.   BUN 8 8 - 23 mg/dL   Creatinine, Ser 9.07 0.44 - 1.00 mg/dL   Calcium  8.4 (L) 8.9 - 10.3 mg/dL   GFR, Estimated >39 >39 mL/min    Comment: (NOTE) Calculated using the CKD-EPI Creatinine Equation (2021)    Anion gap 9 5 - 15    Comment: Performed at [facility], [address], [place], [place] [address]  CBC     Status: None   Collection Time:  06/04/24  5:03 AM  Result Value Ref Range   WBC 9.9 4.0 - 10.5 K/uL   RBC 3.99 3.87 - 5.11 MIL/uL   Hemoglobin 12.4 12.0 - 15.0 g/dL   HCT 61.7 63.9 - 53.9 %   MCV 95.7 80.0 - 100.0 fL   MCH 31.1 26.0 - 34.0 pg   MCHC 32.5 30.0 - 36.0 g/dL   RDW 86.9 88.4 - 84.4 %   Platelets 231 150 - 400 K/uL   nRBC 0.0 0.0 - 0.2 %    Comment: Performed at [facility], [address], [place], [place] [address]  Magnesium      Status: Abnormal   Collection Time: 06/04/24  5:03 AM  Result Value Ref Range   Magnesium  1.6 (L) 1.7 - 2.4 mg/dL    Comment: Performed at [facility], [address] [place], [place] [address]    Imaging / Studies: No results found.  Medications / Allergies: per chart  Antibiotics: Anti-infectives (From admission, onward)    Start     Dose/Rate Route Frequency Ordered Stop   06/03/24 2000  cefoTEtan  (CEFOTAN ) 2 g in sodium chloride  0.9 % 100 mL IVPB        2 g 200 mL/hr over 30 Minutes Intravenous Every 12 hours 06/03/24 1500 06/03/24 2048   06/03/24 1400  neomycin  (MYCIFRADIN ) tablet 1,000 mg  Status:  Discontinued       Placed in And Linked Group   1,000 mg Oral 3 times per day 06/03/24 0630 06/03/24 0634   06/03/24 1400  metroNIDAZOLE  (FLAGYL ) tablet 1,000 mg  Status:  Discontinued       Placed in And Linked Group   1,000 mg Oral 3 times per day 06/03/24 0630 06/03/24 0634   06/03/24 1400  neomycin  (MYCIFRADIN ) tablet 1,000 mg  Status:  Discontinued       Placed in And Linked Group   1,000 mg Oral 3 times per day 06/03/24 0630 06/03/24 0634   06/03/24 1400  metroNIDAZOLE  (FLAGYL ) tablet 1,000 mg  Status:  Discontinued       Placed in And Linked Group   1,000 mg Oral 3 times per day 06/03/24 0630 06/03/24 0634   06/03/24 0645  cefoTEtan  (CEFOTAN ) 2 g in sodium chloride  0.9 % 100 mL IVPB  Status:  Discontinued        2 g 200 mL/hr over 30 Minutes Intravenous On call to O.R. 06/03/24 0630 06/03/24 0634   06/03/24  0645  cefoTEtan  (CEFOTAN ) 2 g in sodium chloride  0.9 % 100 mL IVPB  2 g 200 mL/hr over 30 Minutes Intravenous On call to O.R. 06/03/24 0630 06/03/24 9076         Note: Portions of this report may have been transcribed using voice recognition software. Every effort was made to ensure accuracy; however, inadvertent computerized transcription errors may be present.   Any transcriptional errors that result from this process are unintentional.    Elspeth KYM Schultze, MD, FACS, MASCRS Esophageal, Gastrointestinal & Colorectal Surgery Robotic and Minimally Invasive Surgery  [facility] [address] [place], [place] [address] [phone] Fax [phone] Main  CONTACT INFORMATION: Weekday (9AM-5PM): Call [facility] main office at [phone] Weeknight (5PM-9AM) or Weekend/Holiday: Check EPIC Web Links tab & use AMION (password  [email or web address]) for General Surgery [facility] coverage  Please, DO NOT use SecureChat  (it is not reliable communication to reach operating surgeons & will lead to a delay in care).   Epic staff messaging available for outptient concerns needing 1-2 business day response.      06/04/2024  6:18 AM

## 2024-06-04 NOTE — Progress Notes (Signed)
 1 yr TCS noted in recall Patient result letter mailed procedure note and pathology result faxed to PCP

## 2024-06-04 NOTE — Progress Notes (Signed)
 Mobility Specialist - Progress Note   06/04/24 1130  Mobility  Activity Ambulated independently in hallway  Level of Assistance Independent  Assistive Device None  Distance Ambulated (ft) 500 ft  Activity Response Tolerated well  Mobility Referral Yes  Mobility visit 1 Mobility  Mobility Specialist Start Time (ACUTE ONLY) 1115  Mobility Specialist Stop Time (ACUTE ONLY) 1128  Mobility Specialist Time Calculation (min) (ACUTE ONLY) 13 min   Pt received in recliner and agreeable to mobility. No complaints during session. Pt to recliner after session with all needs met.   [facility]

## 2024-06-05 LAB — SURGICAL PATHOLOGY

## 2024-06-05 LAB — CREATININE, SERUM
Creatinine, Ser: 0.83 mg/dL (ref 0.44–1.00)
GFR, Estimated: >60 mL/min (ref >60)

## 2024-06-05 LAB — POTASSIUM: Potassium: 4.1 mmol/L (ref 3.5–5.1)

## 2024-06-05 LAB — HEMOGLOBIN: Hemoglobin: 11.6 g/dL — ABNORMAL LOW (ref 12.0–15.0)

## 2024-06-05 MED ORDER — HYDROCODONE-ACETAMINOPHEN 10-325 MG PO TABS
0.5000 | ORAL_TABLET | ORAL | Status: DC | PRN
  Administered 2024-06-05 – 2024-06-08 (×12): 1 via ORAL
  Filled 2024-06-05 (×12): qty 1

## 2024-06-05 MED ORDER — HYDROCODONE-ACETAMINOPHEN 10-325 MG PO TABS: 0.5000 | ORAL_TABLET | ORAL | 0 refills | Status: AC | PRN

## 2024-06-05 MED ORDER — CYCLOBENZAPRINE HCL 10 MG PO TABS
10.0000 mg | ORAL_TABLET | Freq: Three times a day (TID) | ORAL | Status: DC
  Administered 2024-06-05 – 2024-06-08 (×9): 10 mg via ORAL
  Filled 2024-06-05 (×10): qty 1

## 2024-06-05 MED ORDER — CYCLOBENZAPRINE HCL 10 MG PO TABS: 10.0000 mg | ORAL_TABLET | Freq: Three times a day (TID) | ORAL | 0 refills | Status: AC

## 2024-06-05 MED ORDER — GABAPENTIN 100 MG PO CAPS
300.0000 mg | ORAL_CAPSULE | Freq: Every day | ORAL | Status: AC
  Administered 2024-06-05 – 2024-06-07 (×3): 300 mg via ORAL
  Filled 2024-06-05 (×3): qty 3

## 2024-06-05 NOTE — Progress Notes (Signed)
 Mobility Specialist - Progress Note   06/05/24 1022  Mobility  Activity Ambulated independently in hallway  Level of Assistance Modified independent, requires aide device or extra time  Assistive Device None  Distance Ambulated (ft) 600 ft  Activity Response Tolerated well  Mobility Referral Yes  Mobility visit 1 Mobility  Mobility Specialist Start Time (ACUTE ONLY) 1009  Mobility Specialist Stop Time (ACUTE ONLY) 1021  Mobility Specialist Time Calculation (min) (ACUTE ONLY) 12 min   Pt received in bed and agreeable to mobility. No complaints during session. Pt to bed after session with all needs met.    [employer]

## 2024-06-05 NOTE — Plan of Care (Signed)
   Problem: Activity: Goal: Ability to tolerate increased activity will improve Outcome: Progressing

## 2024-06-05 NOTE — Progress Notes (Signed)
 06/05/2024  Glenda Hill [id number] February 20, 1956  CARE TEAM: PCP: Rosamond Leta NOVAK, MD  Outpatient Care Team: Patient Care Team: Rosamond Leta NOVAK, MD as PCP - General (Internal Medicine) Mallipeddi, Diannah SQUIBB, MD as PCP - Cardiology (Cardiology) Maranda Ellaree CROME, MD as Referring Physician (Surgery) Delford Maude BROCKS, MD as Consulting Physician (Cardiology) Darlean Ozell NOVAK, MD as Consulting Physician (Pulmonary Disease) Alix Charleston, MD as Attending Physician (Neurosurgery) Sheldon Standing, MD as Consulting Physician (Colon and Rectal Surgery) Odean Potts, MD as Consulting Physician (Hematology and Oncology) Vanderbilt Ned, MD as Consulting Physician (General Surgery) Dewey Rush, MD as Consulting Physician (Radiation Oncology) Cinderella Deatrice FALCON, MD as Consulting Physician (Gastroenterology) Devere Lonni Righter, MD as Consulting Physician (Urology)  Inpatient Treatment Team: Treatment Team:  Sheldon Standing, MD Devere Lonni Righter, MD Brien Marylynn DEL, RN Claudina Barrows, NT Beatrice Cathlean HERO, RN Johnson, Femi HERO Lynwood Setter, [facility] Estelle Hunter DEL, RN   Problem List:   Principal Problem:   Stricture of sigmoid colon [facility]) Active Problems:   Tobacco abuse   Diverticulitis of colon with perforation   Essential hypertension   Colostomy in place [facility])   COPD (chronic obstructive pulmonary disease) (HCC)   Anxiety   Hypokalemia   Hypomagnesemia   06/03/2024  POST-OPERATIVE DIAGNOSIS:  COLOSTOMY FOR PERFORATED COLON DIVERTICULITIS WITH ABSCESS, DESIRE FOR OSTOMY TAKEDOWN   PROCEDURE:   -ROBOTIC DISTAL LEFT COLECTOMY WITH TAKEDOWN OF LOOP TRANSVERSE COLOSTOMY WITH ANASTOMOSIS -ROBOTIC LYSIS OF ADHESIONS x90 MINUTES (55% OF CASE) -DRAINAGE OF PELVIC ABSCESS -MOBILIZATION OF SPLENIC FLEXURE OF COLON -INTRAOPERATIVE ASSESSMENT OF TISSUE VASCULAR PERFUSION USING ICG (indocyanine green ) IMMUNOFLUORESCENCE -TRANSVERSUS ABDOMINIS PLANE (TAP) BLOCK -  BILATERAL -FLEXIBLE SIGMOIDOSCOPY   SURGEON:  Standing KYM Sheldon, MD  OR FINDINGS:  Thickened phlegmonous distal descending and all of sigmoid colon with dense concrete adhesion to left lateral pelvic rim containing small abscess.  Consistent with diverticulitis with chronic stricture.  Moderately dense especially omental adhesions to anterior abdominal wall and pelvis.  Distal transverse colon loop colostomy with proximally and more inferiorly.  Moderate parastomal hernia.  Few small Swiss cheese incisional hernias containing omentum only in the midline.  Small left ovarian simple cyst left alone.  Enlarged smooth uterus. No obvious metastatic disease on visceral parietal peritoneum or liver.   It is a 31mm EEA anastomosis ( distal transverse colon  connected to proximal rectum.)  It rests 13 cm from the anal verge by rigid proctoscopy.   CASE DATA: Type of patient?: Elective [facility] Private Case Status of Case? Elective Scheduled Infection Present At Time Of Surgery (PATOS)?  ABSCESS    Assessment [facility] Stay = 2 days) 2 Days Post-Op    Recovering relatively well so far except for more soreness with increased activity    Plan:  ERAS protocol.  Follow-up on pathology.  Grossly seems most consistent with diverticulitis with chronic stricture and small abscess  Try to improve pain control.  Tramadol  not adequate.  Switch to hydrocodone .  Continue Tylenol .  Make muscle relaxant scheduled.  Gabapentin   -monitor electrolytes & replace as needed .  Some hypokalemia and hypomagnesemia.  Double check phosphorus as well. Keep K>4, Mg>2, Phos>3  -Hypertension.  Back on carvedilol  amlodipine .  Lower threshold to hold angiotensin II inhibitor as needed.  Smoking cessation with nicotine  patch.  Encouraged her to follow-up with primary for different smoking cessation program since Wellbutrin did not seem to help.  She is interested in nicotine  patches and begged me for prescription  -VTE  prophylaxis- SCDs.  Anticoagulation prophyllaxis SQ as appropriate  -mobilize as tolerated to help recovery.  Enlist therapies in moderate/high risk patients as appropriate  I updated the patient's status to the patient and nurse  Recommendations were made.  Questions were answered.  They expressed understanding & appreciation.  -Disposition:  Disposition:  The patient is from: Home Anticipate discharge to:  Home Anticipated Date of Discharge is:  July 19,2025   Barriers to discharge:  Pending Clinical improvement (more likely than not)  Patient currently is NOT MEDICALLY STABLE for discharge from the hospital from a surgery standpoint.      I reviewed last 24 h vitals and pain scores, last 48 h intake and output, last 24 h labs and trends, and last 24 h imaging results.  I have reviewed this patient's available data, including medical history, events of note, test results, etc as part of my evaluation.   A significant portion of that time was spent in counseling. Care during the described time interval was provided by me.  This care required moderate level of medical decision making.  06/05/2024    Subjective: (Chief complaint)  Patient had good day yesterday walking and sitting up a lot but then felt much more sore through the night.  Tramadol  not adequate and needed IV Dilaudid .  Tolerating liquids well.  Trying solids.  Not particularly nauseated.  Nervous about going home.  Hoping she can get nicotine  patch prescription from me  Objective:  Vital signs:  Vitals:   06/04/24 1316 06/04/24 2053 06/04/24 2222 06/05/24 0556  BP: 122/72  135/76 123/68  Pulse: 66  77 73  Resp: 16  18 17   Temp: (!) 97.5 F (36.4 C)  98 F (36.7 C) 98.9 F (37.2 C)  TempSrc: Oral  Oral Oral  SpO2: 92% 94% 90% 91%  Weight:    94.5 kg  Height:        Last BM Date : 06/04/24  Intake/Output   Yesterday:  07/17 0701 - 07/18 0700 In: 853 [P.O.:850; I.V.:3] Out: 275  [Urine:275] This shift:  No intake/output data recorded.  Bowel function:  Flatus: YES  BM:  YES  Drain: (No drain)   Physical Exam:  General: Pt awake/alert in no acute distress Eyes: PERRL, normal EOM.  Sclera clear.  No icterus Neuro: CN II-XII intact w/o focal sensory/motor deficits. Lymph: No head/neck/groin lymphadenopathy Psych:  No delerium/psychosis/paranoia.  Oriented x 4 HENT: Normocephalic, Mucus membranes moist.  No thrush Neck: Supple, No tracheal deviation.  No obvious thyromegaly Chest: No pain to chest wall compression.  Good respiratory excursion.  No audible wheezing CV:  Pulses intact.  Regular rhythm.  No major extremity edema MS: Normal AROM mjr joints.  No obvious deformity  Abdomen: Soft.  Nondistended.  Tenderness at left upper quadrant old colostomy incision.  Dressing clean dry and intact.  No evidence of peritonitis.  Dressings clean dry and intact.  No incarcerated hernias.  Ext:   No deformity.  No mjr edema.  No cyanosis Skin: No petechiae / purpurea.  No major sores.  Warm and dry    Results:   Cultures: No results found for this or any previous visit (from the past 720 hours).  Labs: Results for orders placed or performed during the hospital encounter of 06/03/24 (from the past 48 hours)  Hemoglobin A1c     Status: None   Collection Time: 06/03/24 12:48 PM  Result Value Ref Range   Hgb A1c MFr Bld 5.2 4.8 -  5.6 %    Comment: (NOTE)         Prediabetes: 5.7 - 6.4         Diabetes: >6.4         Glycemic control for adults with diabetes: <7.0    Mean Plasma Glucose 103 mg/dL    Comment: (NOTE) Performed At: [facility] [address] [place], [place] [id number] Jennette Shorter MD Ey:[id number]   Basic metabolic panel     Status: Abnormal   Collection Time: 06/04/24  5:03 AM  Result Value Ref Range   Sodium 137 135 - 145 mmol/L   Potassium 3.2 (L) 3.5 - 5.1 mmol/L   Chloride 102 98 - 111 mmol/L   CO2 26 22 - 32 mmol/L    Glucose, Bld 128 (H) 70 - 99 mg/dL    Comment: Glucose reference range applies only to samples taken after fasting for at least 8 hours.   BUN 8 8 - 23 mg/dL   Creatinine, Ser 9.07 0.44 - 1.00 mg/dL   Calcium  8.4 (L) 8.9 - 10.3 mg/dL   GFR, Estimated >39 >39 mL/min    Comment: (NOTE) Calculated using the CKD-EPI Creatinine Equation (2021)    Anion gap 9 5 - 15    Comment: Performed at [facility], [address], [place], [place] [address]  CBC     Status: None   Collection Time: 06/04/24  5:03 AM  Result Value Ref Range   WBC 9.9 4.0 - 10.5 K/uL   RBC 3.99 3.87 - 5.11 MIL/uL   Hemoglobin 12.4 12.0 - 15.0 g/dL   HCT 61.7 63.9 - 53.9 %   MCV 95.7 80.0 - 100.0 fL   MCH 31.1 26.0 - 34.0 pg   MCHC 32.5 30.0 - 36.0 g/dL   RDW 86.9 88.4 - 84.4 %   Platelets 231 150 - 400 K/uL   nRBC 0.0 0.0 - 0.2 %    Comment: Performed at [facility], [address] [place], [place] [address]  Magnesium      Status: Abnormal   Collection Time: 06/04/24  5:03 AM  Result Value Ref Range   Magnesium  1.6 (L) 1.7 - 2.4 mg/dL    Comment: Performed at [facility], [address], [place], [place] [address]  Phosphorus     Status: None   Collection Time: 06/04/24  5:03 AM  Result Value Ref Range   Phosphorus 3.4 2.5 - 4.6 mg/dL    Comment: Performed at [facility], [address], [place], [place] [address]  Prealbumin     Status: Abnormal   Collection Time: 06/04/24  5:03 AM  Result Value Ref Range   Prealbumin 16 (L) 18 - 38 mg/dL    Comment: Performed at [facility], [address], [place], [place] [address]  Hemoglobin     Status: Abnormal   Collection Time: 06/05/24  4:31 AM  Result Value Ref Range   Hemoglobin 11.6 (L) 12.0 - 15.0 g/dL    Comment: Performed at [facility], [address], [place], [place] [address]  Potassium     Status: None   Collection Time: 06/05/24  4:31 AM  Result  Value Ref Range   Potassium 4.1 3.5 - 5.1 mmol/L    Comment: Performed at [facility], [address] [place], [place] [address]  Creatinine, serum     Status: None   Collection Time: 06/05/24  4:31 AM  Result Value Ref Range   Creatinine, Ser 0.83 0.44 - 1.00 mg/dL   GFR, Estimated >39 >39 mL/min    Comment: (NOTE) Calculated using the CKD-EPI Creatinine Equation (2021) Performed at [facility], [address] [place], [place] [address]     Imaging / Studies: No results found.  Medications / Allergies: per chart  Antibiotics: Anti-infectives (From admission, onward)    Start     Dose/Rate Route Frequency Ordered Stop   06/03/24 2000  cefoTEtan  (CEFOTAN ) 2 g in sodium chloride  0.9 % 100 mL IVPB        2 g 200 mL/hr over 30 Minutes Intravenous Every 12 hours 06/03/24 1500 06/03/24 2048   06/03/24 1400  neomycin  (MYCIFRADIN ) tablet 1,000 mg  Status:  Discontinued       Placed in And Linked Group   1,000 mg Oral 3 times per day 06/03/24 0630 06/03/24 0634   06/03/24 1400  metroNIDAZOLE  (FLAGYL ) tablet 1,000 mg  Status:  Discontinued       Placed in And Linked Group   1,000 mg Oral 3 times per day 06/03/24 0630 06/03/24 0634   06/03/24 1400  neomycin  (MYCIFRADIN ) tablet 1,000 mg  Status:  Discontinued       Placed in And Linked Group   1,000 mg Oral 3 times per day 06/03/24 0630 06/03/24 0634   06/03/24 1400  metroNIDAZOLE  (FLAGYL ) tablet 1,000 mg  Status:  Discontinued       Placed in And Linked Group   1,000 mg Oral 3 times per day 06/03/24 0630 06/03/24 0634   06/03/24 0645  cefoTEtan  (CEFOTAN ) 2 g in sodium chloride  0.9 % 100 mL IVPB  Status:  Discontinued        2 g 200 mL/hr over 30 Minutes Intravenous On call to O.R. 06/03/24 0630 06/03/24 0634   06/03/24 0645  cefoTEtan  (CEFOTAN ) 2 g in sodium chloride  0.9 % 100 mL IVPB        2 g 200 mL/hr over 30 Minutes Intravenous On call to O.R. 06/03/24 0630 06/03/24 [id number]          Note: Portions of this report may have been transcribed using voice recognition software. Every effort was made to ensure accuracy; however, inadvertent computerized transcription errors may be present.   Any transcriptional errors that result from this process are unintentional.    Elspeth KYM Schultze, MD, FACS, MASCRS Esophageal, Gastrointestinal & Colorectal Surgery Robotic and Minimally Invasive Surgery  [facility] [address] [place], [place] [address] [phone] Fax [phone] Main  CONTACT INFORMATION: Weekday (9AM-5PM): Call [facility] main office at [phone] Weeknight (5PM-9AM) or Weekend/Holiday: Check EPIC Web Links tab & use AMION (password  [email or web address]) for General Surgery [facility] coverage  Please, DO NOT use SecureChat  (it is not reliable communication to reach operating surgeons & will lead to a delay in care).   Epic staff messaging available for outptient concerns needing 1-2 business day response.      06/05/2024  7:38 AM

## 2024-06-06 LAB — BASIC METABOLIC PANEL WITH GFR
Anion gap: 9 (ref 5–15)
BUN: 10 mg/dL (ref 8–23)
CO2: 27 mmol/L (ref 22–32)
Calcium: 8.8 mg/dL — ABNORMAL LOW (ref 8.9–10.3)
Chloride: 100 mmol/L (ref 98–111)
Creatinine, Ser: 0.7 mg/dL (ref 0.44–1.00)
GFR, Estimated: >60 mL/min (ref >60)
Glucose, Bld: 92 mg/dL (ref 70–99)
Potassium: 4.4 mmol/L (ref 3.5–5.1)
Sodium: 136 mmol/L (ref 135–145)

## 2024-06-06 LAB — CBC
HCT: 38.8 % (ref 36.0–46.0)
Hemoglobin: 12.1 g/dL (ref 12.0–15.0)
MCH: 30.6 pg (ref 26.0–34.0)
MCHC: 31.2 g/dL (ref 30.0–36.0)
MCV: 98 fL (ref 80.0–100.0)
Platelets: 228 K/uL (ref 150–400)
RBC: 3.96 MIL/uL (ref 3.87–5.11)
RDW: 13.2 % (ref 11.5–15.5)
WBC: 6.5 K/uL (ref 4.0–10.5)
nRBC: 0 % (ref 0.0–0.2)

## 2024-06-06 LAB — CREATININE, SERUM
Creatinine, Ser: 0.66 mg/dL (ref 0.44–1.00)
GFR, Estimated: >60 mL/min (ref >60)

## 2024-06-06 LAB — MAGNESIUM: Magnesium: 1.8 mg/dL (ref 1.7–2.4)

## 2024-06-06 LAB — POTASSIUM: Potassium: 4.2 mmol/L (ref 3.5–5.1)

## 2024-06-06 LAB — HEMOGLOBIN: Hemoglobin: 11.9 g/dL — ABNORMAL LOW (ref 12.0–15.0)

## 2024-06-06 MED ORDER — MAGNESIUM SULFATE 2 GM/50ML IV SOLN
2.0000 g | Freq: Once | INTRAVENOUS | Status: AC
  Administered 2024-06-06: 2 g via INTRAVENOUS
  Filled 2024-06-06: qty 50

## 2024-06-06 NOTE — Plan of Care (Signed)
  Problem: Activity: Goal: Ability to tolerate increased activity will improve Outcome: Progressing   Problem: Bowel/Gastric: Goal: Gastrointestinal status for postoperative course will improve Outcome: Progressing   Problem: Nutritional: Goal: Will attain and maintain optimal nutritional status will improve Outcome: Progressing   Problem: Pain Managment: Goal: General experience of comfort will improve and/or be controlled Outcome: Progressing

## 2024-06-06 NOTE — Progress Notes (Signed)
 Patient verbalized concern regarding increased bruising to her left side.  Upon assessment of patient's skin,noted to have ecchymosis to her left flank and left hip area.  Skin marker used to outline sites.  Will continue to monitor and assess patient's skin for any changes.  Tao Satz JINNY Quale, RN

## 2024-06-06 NOTE — Progress Notes (Signed)
 3 Days Post-Op   Subjective/Chief Complaint: Feels worse today- LLQ/side pain Some intermittent nausea Having BMs No vomiting Hurts on that side when cough   Objective: Vital signs in last 24 hours: Temp:  [97.4 F (36.3 C)-98.8 F (37.1 C)] 98.8 F (37.1 C) (07/19 0530) Pulse Rate:  [62-78] 78 (07/19 0530) Resp:  [16-18] 18 (07/19 0530) BP: (119-143)/(72-87) 143/83 (07/19 0530) SpO2:  [90 %-94 %] 94 % (07/19 0829) Weight:  [90.3 kg] 90.3 kg (07/19 0533) Last BM Date : 06/06/24  Intake/Output from previous day: 07/18 0701 - 07/19 0700 In: 1533 [P.O.:1530; I.V.:3] Out: 1900 [Urine:1900] Intake/Output this shift: No intake/output data recorded.  Alert, nontoxic Symm chest rise, non labored Reg Soft, min distension, incisions ok. Ecchymosis L lateral abd wall No edema  Lab Results:  Recent Labs    06/04/24 0503 06/05/24 0431 06/06/24 0523  WBC 9.9  --   --   HGB 12.4 11.6* 11.9*  HCT 38.2  --   --   PLT 231  --   --    BMET Recent Labs    06/04/24 0503 06/05/24 0431 06/06/24 0523  NA 137  --   --   K 3.2* 4.1 4.2  CL 102  --   --   CO2 26  --   --   GLUCOSE 128*  --   --   BUN 8  --   --   CREATININE 0.92 0.83 0.66  CALCIUM  8.4*  --   --    PT/INR No results for input(s): LABPROT, INR in the last 72 hours. ABG No results for input(s): PHART, HCO3 in the last 72 hours.  Invalid input(s): PCO2, PO2  Studies/Results: No results found.  Anti-infectives: Anti-infectives (From admission, onward)    Start     Dose/Rate Route Frequency Ordered Stop   06/03/24 2000  cefoTEtan  (CEFOTAN ) 2 g in sodium chloride  0.9 % 100 mL IVPB        2 g 200 mL/hr over 30 Minutes Intravenous Every 12 hours 06/03/24 1500 06/03/24 2048   06/03/24 1400  neomycin  (MYCIFRADIN ) tablet 1,000 mg  Status:  Discontinued       Placed in And Linked Group   1,000 mg Oral 3 times per day 06/03/24 0630 06/03/24 0634   06/03/24 1400  metroNIDAZOLE  (FLAGYL ) tablet  1,000 mg  Status:  Discontinued       Placed in And Linked Group   1,000 mg Oral 3 times per day 06/03/24 0630 06/03/24 0634   06/03/24 1400  neomycin  (MYCIFRADIN ) tablet 1,000 mg  Status:  Discontinued       Placed in And Linked Group   1,000 mg Oral 3 times per day 06/03/24 0630 06/03/24 0634   06/03/24 1400  metroNIDAZOLE  (FLAGYL ) tablet 1,000 mg  Status:  Discontinued       Placed in And Linked Group   1,000 mg Oral 3 times per day 06/03/24 0630 06/03/24 0634   06/03/24 0645  cefoTEtan  (CEFOTAN ) 2 g in sodium chloride  0.9 % 100 mL IVPB  Status:  Discontinued        2 g 200 mL/hr over 30 Minutes Intravenous On call to O.R. 06/03/24 0630 06/03/24 0634   06/03/24 0645  cefoTEtan  (CEFOTAN ) 2 g in sodium chloride  0.9 % 100 mL IVPB        2 g 200 mL/hr over 30 Minutes Intravenous On call to O.R. 06/03/24 0630 06/03/24 9076       Assessment/Plan: s/p Procedure(s) with  comments: CYSTOSCOPY WITH INDOCYANINE GREEN  IMAGING (ICG) (N/A) CLOSURE, COLOSTOMY, ROBOT-ASSISTED (N/A) - ROBOTIC OSTOMY TAKEDOWN COLECTOMY, SIGMOID, ROBOT-ASSISTED (N/A) - COLECTOMY LYSIS, ADHESIONS, ROBOT-ASSISTED, LAPAROSCOPIC (N/A) - LYSIS OF ADHESIONS SIGMOIDOSCOPY, FLEXIBLE (N/A)  Pt reports feeling worse today, rough night with L abd pain No fever. No tachy Has ecchymosis on that- ?hematoma Will check labs this am Will keep today Cont diet Cont chemical vte prophylaxis for now  LOS: 3 days    Glenda Hill 06/06/2024

## 2024-06-06 NOTE — Plan of Care (Signed)
   Problem: Education: Goal: Knowledge of General Education information will improve Description Including pain rating scale, medication(s)/side effects and non-pharmacologic comfort measures Outcome: Progressing

## 2024-06-07 LAB — CREATININE, SERUM
Creatinine, Ser: 0.77 mg/dL (ref 0.44–1.00)
GFR, Estimated: >60 mL/min (ref >60)

## 2024-06-07 LAB — HEMOGLOBIN: Hemoglobin: 11.6 g/dL — ABNORMAL LOW (ref 12.0–15.0)

## 2024-06-07 LAB — POTASSIUM: Potassium: 4.5 mmol/L (ref 3.5–5.1)

## 2024-06-07 NOTE — Progress Notes (Signed)
 4 Days Post-Op   Subjective/Chief Complaint: Some intermittent nausea Having BMs No vomiting States she still does not feel that great.  She is having bruising on her left flank. She now has a sore throat.   Objective: Vital signs in last 24 hours: Temp:  [98.3 F (36.8 C)-98.5 F (36.9 C)] 98.5 F (36.9 C) (07/20 0614) Pulse Rate:  [68-78] 68 (07/20 0614) Resp:  [16-18] 18 (07/20 0614) BP: (117-141)/(60-76) 120/60 (07/20 0614) SpO2:  [90 %-94 %] 92 % (07/20 0735) Weight:  [96.2 kg] 96.2 kg (07/20 0500) Last BM Date : 06/07/24  Intake/Output from previous day: 07/19 0701 - 07/20 0700 In: 1200 [P.O.:1200] Out: 900 [Urine:900] Intake/Output this shift: No intake/output data recorded.  Alert, nontoxic Symm chest rise, non labored Reg Soft, min distension, incisions ok. Ecchymosis L lateral abd wall; left flank-no cellulitis No edema  Lab Results:  Recent Labs    06/06/24 0859 06/07/24 0507  WBC 6.5  --   HGB 12.1 11.6*  HCT 38.8  --   PLT 228  --    BMET Recent Labs    06/06/24 0859 06/07/24 0507  NA 136  --   K 4.4 4.5  CL 100  --   CO2 27  --   GLUCOSE 92  --   BUN 10  --   CREATININE 0.70 0.77  CALCIUM  8.8*  --    PT/INR No results for input(s): LABPROT, INR in the last 72 hours. ABG No results for input(s): PHART, HCO3 in the last 72 hours.  Invalid input(s): PCO2, PO2  Studies/Results: No results found.  Anti-infectives: Anti-infectives (From admission, onward)    Start     Dose/Rate Route Frequency Ordered Stop   06/03/24 2000  cefoTEtan  (CEFOTAN ) 2 g in sodium chloride  0.9 % 100 mL IVPB        2 g 200 mL/hr over 30 Minutes Intravenous Every 12 hours 06/03/24 1500 06/03/24 2048   06/03/24 1400  neomycin  (MYCIFRADIN ) tablet 1,000 mg  Status:  Discontinued       Placed in And Linked Group   1,000 mg Oral 3 times per day 06/03/24 0630 06/03/24 0634   06/03/24 1400  metroNIDAZOLE  (FLAGYL ) tablet 1,000 mg  Status:   Discontinued       Placed in And Linked Group   1,000 mg Oral 3 times per day 06/03/24 0630 06/03/24 0634   06/03/24 1400  neomycin  (MYCIFRADIN ) tablet 1,000 mg  Status:  Discontinued       Placed in And Linked Group   1,000 mg Oral 3 times per day 06/03/24 0630 06/03/24 0634   06/03/24 1400  metroNIDAZOLE  (FLAGYL ) tablet 1,000 mg  Status:  Discontinued       Placed in And Linked Group   1,000 mg Oral 3 times per day 06/03/24 0630 06/03/24 0634   06/03/24 0645  cefoTEtan  (CEFOTAN ) 2 g in sodium chloride  0.9 % 100 mL IVPB  Status:  Discontinued        2 g 200 mL/hr over 30 Minutes Intravenous On call to O.R. 06/03/24 0630 06/03/24 0634   06/03/24 0645  cefoTEtan  (CEFOTAN ) 2 g in sodium chloride  0.9 % 100 mL IVPB        2 g 200 mL/hr over 30 Minutes Intravenous On call to O.R. 06/03/24 0630 06/03/24 0923       Assessment/Plan: s/p Procedure(s) with comments: CYSTOSCOPY WITH INDOCYANINE GREEN  IMAGING (ICG) (N/A) CLOSURE, COLOSTOMY, ROBOT-ASSISTED (N/A) - ROBOTIC OSTOMY TAKEDOWN COLECTOMY, SIGMOID, ROBOT-ASSISTED (N/A) -  COLECTOMY LYSIS, ADHESIONS, ROBOT-ASSISTED, LAPAROSCOPIC (N/A) - LYSIS OF ADHESIONS SIGMOIDOSCOPY, FLEXIBLE (N/A)  No fever. No tachy Has ecchymosis on that- ?hematoma-does not appear to be expanding. Labs okay.  Hemoglobin stable Will keep today Cont diet Cont chemical vte prophylaxis for now Patient should be able to go home tomorrow and this was discussed with her.  LOS: 4 days    Glenda Hill 06/07/2024

## 2024-06-08 ENCOUNTER — Ambulatory Visit: Payer: Self-pay | Admitting: Surgery

## 2024-06-08 NOTE — Progress Notes (Signed)
 Reviewed d/c instructions with pt. All questions answered. Pt awaiting family arrival for transport home.

## 2024-06-08 NOTE — Progress Notes (Signed)
 Pt s/p left colectomy for diverticulitis Path benign. I told the pt the good news  Copy of report in d/c paperwork for pt

## 2024-06-08 NOTE — Plan of Care (Signed)
  Problem: Activity: Goal: Ability to tolerate increased activity will improve Outcome: Progressing   Problem: Nutritional: Goal: Will attain and maintain optimal nutritional status will improve Outcome: Progressing

## 2024-06-08 NOTE — Discharge Summary (Signed)
 Physician Discharge Summary    Glenda Hill MRN: [id number] DOB/AGE: Apr 01, 1956 = 68 y.o.  Patient Care Team: Rosamond Leta NOVAK, MD as PCP - General (Internal Medicine) Mallipeddi, Diannah SQUIBB, MD as PCP - Cardiology (Cardiology) Maranda Ellaree CROME, MD as Referring Physician (Surgery) Delford Maude BROCKS, MD as Consulting Physician (Cardiology) Darlean Ozell NOVAK, MD as Consulting Physician (Pulmonary Disease) Alix Charleston, MD as Attending Physician (Neurosurgery) Sheldon Standing, MD as Consulting Physician (Colon and Rectal Surgery) Odean Potts, MD as Consulting Physician (Hematology and Oncology) Vanderbilt Ned, MD as Consulting Physician (General Surgery) Dewey Rush, MD as Consulting Physician (Radiation Oncology) Cinderella Deatrice FALCON, MD as Consulting Physician (Gastroenterology) Devere Lonni Righter, MD as Consulting Physician (Urology)  Admit date: 06/03/2024  Discharge date: 06/08/2024  Hospital Stay = 5 days    Discharge Diagnoses:  Principal Problem:   Stricture of sigmoid colon [facility]) Active Problems:   Tobacco abuse   Diverticulitis of colon with perforation   Essential hypertension   Colostomy in place [facility])   COPD (chronic obstructive pulmonary disease) (HCC)   Anxiety   Hypokalemia   Hypomagnesemia   5 Days Post-Op  06/03/2024  POST-OPERATIVE DIAGNOSIS:   COLOSTOMY FOR COLON PERFORATION DESIRE FOR OSTOMY TAKEDOWN  SURGERY:  06/03/2024  Procedure(s): CYSTOSCOPY WITH INDOCYANINE GREEN  IMAGING (ICG) CLOSURE, COLOSTOMY, ROBOT-ASSISTED COLECTOMY, SIGMOID, ROBOT-ASSISTED LYSIS, ADHESIONS, ROBOT-ASSISTED, LAPAROSCOPIC SIGMOIDOSCOPY, FLEXIBLE  SURGEON:    Surgeon(s): Devere Lonni Righter, MD Sheldon Standing, MD Ned Hila, MD  Consults: Physical Therapy, Pharmacy, Nutrition, and Anesthesia  Hospital Course:   The patient underwent the surgery above.  Postoperatively, the patient gradually mobilized and advanced to a solid diet.  Pain and  other symptoms were treated aggressively.    By the time of discharge, the patient was walking well the hallways, eating food, having flatus.  Pain was well-controlled on an oral medications.  Based on meeting discharge criteria and continuing to recover, I felt it was safe for the patient to be discharged from the hospital to further recover with close followup. Postoperative recommendations were discussed in detail.  They are written as well.  Discharged Condition: good  Discharge Exam: Blood pressure 135/74, pulse 72, temperature (!) 97.5 F (36.4 C), temperature source Oral, resp. rate 17, height 5' 7 (1.702 m), weight 92 kg, SpO2 93%.  General: Pt awake/alert/oriented x4 in No acute distress Eyes: PERRL, normal EOM.  Sclera clear.  No icterus Neuro: CN II-XII intact w/o focal sensory/motor deficits. Lymph: No head/neck/groin lymphadenopathy Psych:  No delerium/psychosis/paranoia HENT: Normocephalic, Mucus membranes moist.  No thrush Neck: Supple, No tracheal deviation Chest: No chest wall pain w good excursion CV:  Pulses intact.  Regular rhythm MS: Normal AROM mjr joints.  No obvious deformity Abdomen: Soft.  Nondistended.  Mildly tender at incisions only.  Incisions c/d/I.  Left flank ecchymosis moderate.  No evidence of peritonitis.  No incarcerated hernias. Ext:  SCDs BLE.  No mjr edema.  No cyanosis Skin: No petechiae / purpura   Disposition:    Follow-up Information     Sheldon Standing, MD Follow up in 1 month(s).   Specialties: General Surgery, Colon and Rectal Surgery Contact information: [address] [place] [address] [phone]                 Discharge disposition: 01-Home or Self Care       Discharge Instructions     Call MD for:   Complete by: As directed    FEVER > 101.5 F  (  temperatures < 101.5 F are not significant)   Call MD for:  extreme fatigue   Complete by: As directed    Call MD for:  persistant dizziness or  light-headedness   Complete by: As directed    Call MD for:  persistant nausea and vomiting   Complete by: As directed    Call MD for:  redness, tenderness, or signs of infection (pain, swelling, redness, odor or green/yellow discharge around incision site)   Complete by: As directed    Call MD for:  severe uncontrolled pain   Complete by: As directed    Diet - low sodium heart healthy   Complete by: As directed    Start with a bland diet such as soups, liquids, starchy foods, low fat foods, etc. the first few days at home. Gradually advance to a solid, low-fat, high fiber diet by the end of the first week at home.   Add a fiber supplement to your diet (Metamucil, etc) If you feel full, bloated, or constipated, stay on a full liquid or pureed/blenderized diet for a few days until you feel better and are no longer constipated.   Discharge instructions   Complete by: As directed    See Discharge Instructions If you are not getting better after two weeks or are noticing you are getting worse, contact our office [phone] for further advice.  We may need to adjust your medications, re-evaluate you in the office, send you to the emergency room, or see what other things we can do to help. The clinic staff is available to answer your questions during regular business hours (8:30am-5pm).  Please don't hesitate to call and ask to speak to one of our nurses for clinical concerns.    A surgeon from [facility] is always on call at the hospitals 24 hours/day If you have a medical emergency, go to the nearest emergency room or call 911.   Discharge wound care:   Complete by: As directed    It is good for closed incisions and even open wounds to be washed every day.  Shower every day.  Short baths are fine.  Wash the incisions and wounds clean with soap & water .    You may leave closed incisions open to air if it is dry.   You may cover the incision with clean gauze & replace it after  your daily shower for comfort.   Driving Restrictions   Complete by: As directed    You may drive when: - you are no longer taking narcotic prescription pain medication - you can comfortably wear a seatbelt - you can safely make sudden turns/stops without pain.   Increase activity slowly   Complete by: As directed    Start light daily activities --- self-care, walking, climbing stairs- beginning the day after surgery.  Gradually increase activities as tolerated.  Control your pain to be active.  Stop when you are tired.  Ideally, walk several times a day, eventually an hour a day.   Most people are back to most day-to-day activities in a few weeks.  It takes 4-6 weeks to get back to unrestricted, intense activity. If you can walk 30 minutes without difficulty, it is safe to try more intense activity such as jogging, treadmill, bicycling, low-impact aerobics, swimming, etc. Save the most intensive and strenuous activity for last (Usually 4-8 weeks after surgery) such as sit-ups, heavy lifting, contact sports, etc.  Refrain from any intense heavy lifting or straining until  you are off narcotics for pain control.  You will have off days, but things should improve week-by-week. DO NOT PUSH THROUGH PAIN.  Let pain be your guide: If it hurts to do something, don't do it.   Lifting restrictions   Complete by: As directed    If you can walk 30 minutes without difficulty, it is safe to try more intense activity such as jogging, treadmill, bicycling, low-impact aerobics, swimming, etc. Save the most intensive and strenuous activity for last (Usually 4-8 weeks after surgery) such as sit-ups, heavy lifting, contact sports, etc.   Refrain from any intense heavy lifting or straining until you are off narcotics for pain control.  You will have off days, but things should improve week-by-week. DO NOT PUSH THROUGH PAIN.  Let pain be your guide: If it hurts to do something, don't do it.  Pain is your body warning  you to avoid that activity for another week until the pain goes down.   May shower / Bathe   Complete by: As directed    May walk up steps   Complete by: As directed    Sexual Activity Restrictions   Complete by: As directed    You may have sexual intercourse when it is comfortable. If it hurts to do something, stop.       Allergies as of 06/08/2024       Reactions   Other Other (See Comments)   Years ago, and UNNAMED antidepressant made the patient want to hurt herself- was NOT tolerated well        Medication List     TAKE these medications    amLODipine  10 MG tablet Commonly known as: NORVASC  Take 10 mg by mouth daily.   Calcium  Carb-Cholecalciferol  600-20 MG-MCG Tabs Take 2 tablets by mouth daily.   carvedilol  12.5 MG tablet Commonly known as: COREG  Take 12.5 mg by mouth 2 (two) times daily.   cyclobenzaprine  10 MG tablet Commonly known as: FLEXERIL  Take 1 tablet (10 mg total) by mouth 3 (three) times daily.   HYDROcodone -acetaminophen  10-325 MG tablet Commonly known as: NORCO Take 0.5-1 tablets by mouth every 4 (four) hours as needed for moderate pain (pain score 4-6) or severe pain (pain score 7-10).   ibuprofen 200 MG tablet Commonly known as: ADVIL Take 400 mg by mouth every 6 (six) hours as needed for headache (or pain).   levocetirizine 5 MG tablet Commonly known as: XYZAL Take 5 mg by mouth daily.   losartan  100 MG tablet Commonly known as: COZAAR  Take 100 mg by mouth every evening.   multivitamin with minerals Tabs tablet Take 1 tablet by mouth daily. Woman   nicotine  14 mg/24hr patch Commonly known as: Nicoderm CQ  Place 1 patch (14 mg total) onto the skin daily.   pantoprazole  40 MG tablet Commonly known as: PROTONIX  Take 40 mg by mouth daily before breakfast.   tamoxifen  10 MG tablet Commonly known as: NOLVADEX  Take 1 tablet (10 mg total) by mouth daily.   Trelegy Ellipta 100-62.5-25 MCG/ACT Aepb Generic drug:  Fluticasone-Umeclidin-Vilant Inhale 1 puff into the lungs daily.   Vitamin D  (Ergocalciferol ) 1.25 MG (50000 UNIT) Caps capsule Commonly known as: DRISDOL  Take 50,000 Units by mouth every Monday.               Discharge Care Instructions  (From admission, onward)           Start     Ordered   06/05/24 0000  Discharge wound care:  Comments: It is good for closed incisions and even open wounds to be washed every day.  Shower every day.  Short baths are fine.  Wash the incisions and wounds clean with soap & water .    You may leave closed incisions open to air if it is dry.   You may cover the incision with clean gauze & replace it after your daily shower for comfort.   06/05/24 0743            Significant Diagnostic Studies:  Results for orders placed or performed during the hospital encounter of 06/03/24 (from the past 72 hours)  Hemoglobin     Status: Abnormal   Collection Time: 06/06/24  5:23 AM  Result Value Ref Range   Hemoglobin 11.9 (L) 12.0 - 15.0 g/dL    Comment: Performed at [facility], [address], [place], [place] [address]  Potassium     Status: None   Collection Time: 06/06/24  5:23 AM  Result Value Ref Range   Potassium 4.2 3.5 - 5.1 mmol/L    Comment: Performed at [facility], [address], [place], [place] [address]  Creatinine, serum     Status: None   Collection Time: 06/06/24  5:23 AM  Result Value Ref Range   Creatinine, Ser 0.66 0.44 - 1.00 mg/dL   GFR, Estimated >39 >39 mL/min    Comment: (NOTE) Calculated using the CKD-EPI Creatinine Equation (2021) Performed at [facility], [address], [place], [place] [address]   CBC     Status: None   Collection Time: 06/06/24  8:59 AM  Result Value Ref Range   WBC 6.5 4.0 - 10.5 K/uL   RBC 3.96 3.87 - 5.11 MIL/uL   Hemoglobin 12.1 12.0 - 15.0 g/dL   HCT 61.1 63.9 - 53.9 %   MCV 98.0 80.0 - 100.0 fL   MCH 30.6 26.0 - 34.0  pg   MCHC 31.2 30.0 - 36.0 g/dL   RDW 86.7 88.4 - 84.4 %   Platelets 228 150 - 400 K/uL   nRBC 0.0 0.0 - 0.2 %    Comment: Performed at [facility], [address], [place], [place] [address]  Basic metabolic panel     Status: Abnormal   Collection Time: 06/06/24  8:59 AM  Result Value Ref Range   Sodium 136 135 - 145 mmol/L   Potassium 4.4 3.5 - 5.1 mmol/L   Chloride 100 98 - 111 mmol/L   CO2 27 22 - 32 mmol/L   Glucose, Bld 92 70 - 99 mg/dL    Comment: Glucose reference range applies only to samples taken after fasting for at least 8 hours.   BUN 10 8 - 23 mg/dL   Creatinine, Ser 9.29 0.44 - 1.00 mg/dL   Calcium  8.8 (L) 8.9 - 10.3 mg/dL   GFR, Estimated >39 >39 mL/min    Comment: (NOTE) Calculated using the CKD-EPI Creatinine Equation (2021)    Anion gap 9 5 - 15    Comment: Performed at [facility] [address], [place], [place] [address]  Magnesium      Status: None   Collection Time: 06/06/24  8:59 AM  Result Value Ref Range   Magnesium  1.8 1.7 - 2.4 mg/dL    Comment: Performed at [facility], [address], [place], [place] [address]  Hemoglobin     Status: Abnormal   Collection Time: 06/07/24  5:07 AM  Result  Value Ref Range   Hemoglobin 11.6 (L) 12.0 - 15.0 g/dL    Comment: Performed at [facility], [address], [place], [place] [address]  Potassium     Status: None   Collection Time: 06/07/24  5:07 AM  Result Value Ref Range   Potassium 4.5 3.5 - 5.1 mmol/L    Comment: Performed at [facility], [address], [place], [place] [address]  Creatinine, serum     Status: None   Collection Time: 06/07/24  5:07 AM  Result Value Ref Range   Creatinine, Ser 0.77 0.44 - 1.00 mg/dL   GFR, Estimated >39 >39 mL/min    Comment: (NOTE) Calculated using the CKD-EPI Creatinine Equation (2021) Performed at [facility], [address], [place],  [place] [address]     No results found.  Past Medical History:  Diagnosis Date   Colostomy in place [facility])    Complication of anesthesia    trouble waking up after C section   COPD (chronic obstructive pulmonary disease) (HCC)    Diverticulitis    Ductal carcinoma in situ (DCIS) of left breast 2024   GERD (gastroesophageal reflux disease)    Hypertension     Past Surgical History:  Procedure Laterality Date   BACK SURGERY     x2 2014 and 2018   BREAST BIOPSY Left 06/03/2023   MM LT BREAST BX W LOC DEV 1ST LESION IMAGE BX SPEC STEREO GUIDE 06/03/2023 [facility]   BREAST BIOPSY  07/02/2023   MM LT RADIOACTIVE SEED LOC MAMMO GUIDE 07/02/2023 [facility]   BREAST LUMPECTOMY WITH RADIOACTIVE SEED LOCALIZATION Left 07/04/2023   Procedure: LEFT BREAST LUMPECTOMY WITH RADIOACTIVE SEED LOCALIZATION;  Surgeon: Vanderbilt Ned, MD;  Location: [facility] OR;  Service: General;  Laterality: Left;   CESAREAN SECTION     COLECTOMY  09/2021   COLECTOMY, SIGMOID, ROBOT-ASSISTED N/A 06/03/2024   Procedure: COLECTOMY, SIGMOID, ROBOT-ASSISTED;  Surgeon: Sheldon Standing, MD;  Location: [facility];  Service: General;  Laterality: N/A;  COLECTOMY   COLONOSCOPY N/A 05/28/2024   Procedure: COLONOSCOPY;  Surgeon: Cinderella Deatrice FALCON, MD;  Location: [facility];  Service: Endoscopy;  Laterality: N/A;  1245pm, asa 1-2   CYSTOSCOPY WITH INDOCYANINE GREEN  IMAGING (ICG) N/A 06/03/2024   Procedure: CYSTOSCOPY WITH INDOCYANINE GREEN  IMAGING (ICG);  Surgeon: Devere Lonni Righter, MD;  Location: [facility];  Service: Urology;  Laterality: N/A;   FLEXIBLE SIGMOIDOSCOPY N/A 06/03/2024   Procedure: KINGSTON SIDE;  Surgeon: Sheldon Standing, MD;  Location: [facility];  Service: General;  Laterality: N/A;   ROBOTIC ASSISTED LAPAROSCOPIC LYSIS OF ADHESION N/A 06/03/2024   Procedure: LYSIS, ADHESIONS, ROBOT-ASSISTED, LAPAROSCOPIC;  Surgeon: Sheldon Standing, MD;  Location: [facility];  Service: General;  Laterality: N/A;  LYSIS OF  ADHESIONS   TONSILLECTOMY     removed as a child   XI ROBOTIC ASSISTED COLOSTOMY TAKEDOWN N/A 06/03/2024   Procedure: CLOSURE, COLOSTOMY, ROBOT-ASSISTED;  Surgeon: Sheldon Standing, MD;  Location: [facility];  Service: General;  Laterality: N/A;  ROBOTIC OSTOMY TAKEDOWN    Social History   Socioeconomic History   Marital status: Significant Other    Spouse name: Not on file   Number of children: 1   Years of education: 11   Highest education level: Not on file  Occupational History   Not on file  Tobacco Use   Smoking status: Every Day    Current packs/day: 1.00    Average packs/day: 1 pack/day for 51.6 years (51.6  ttl pk-yrs)    Types: Cigarettes    Start date: 1974   Smokeless tobacco: Never   Tobacco comments:    stopped for a while   Vaping Use   Vaping status: Never Used  Substance and Sexual Activity   Alcohol use: Not Currently   Drug use: No   Sexual activity: Not on file  Other Topics Concern   Not on file  Social History Narrative   Not on file   Social Drivers of Health   Financial Resource Strain: Not on file  Food Insecurity: No Food Insecurity (06/03/2024)   Hunger Vital Sign    Worried About Running Out of Food in the Last Year: Never true    Ran Out of Food in the Last Year: Never true  Transportation Needs: No Transportation Needs (06/03/2024)   PRAPARE - Administrator, Civil Service (Medical): No    Lack of Transportation (Non-Medical): No  Physical Activity: Not on file  Stress: Not on file  Social Connections: Moderately Integrated (06/03/2024)   Social Connection and Isolation Panel    Frequency of Communication with Friends and Family: More than three times a week    Frequency of Social Gatherings with Friends and Family: More than three times a week    Attends Religious Services: More than 4 times per year    Active Member of [employer] or Organizations: No    Attends Banker Meetings: Never    Marital Status: Living with  partner  Intimate Partner Violence: Not At Risk (06/03/2024)   Humiliation, Afraid, Rape, and Kick questionnaire    Fear of Current or Ex-Partner: No    Emotionally Abused: No    Physically Abused: No    Sexually Abused: No    Family History  Problem Relation Age of Onset   Heart disease Mother    Lung cancer Mother 40       smoking hx   Emphysema Mother        smoking hx   Ovarian cancer Sister        dx 30-50   Cervical cancer Paternal Grandmother        dx unknown age   Arthritis Other    Lung disease Other    Diabetes Other    Cancer Cousin 60       stomach and lung; pat female cousin    Current Facility-Administered Medications  Medication Dose Route Frequency Provider Last Rate Last Admin   0.9 %  sodium chloride  infusion  250 mL Intravenous PRN Sheldon Standing, MD       albuterol  (PROVENTIL ) (2.5 MG/3ML) 0.083% nebulizer solution 2.5 mg  2.5 mg Nebulization Q6H PRN Sheldon Standing, MD   2.5 mg at 06/03/24 1541   alum & mag hydroxide-simeth (MAALOX/MYLANTA) 200-200-20 MG/5ML suspension 30 mL  30 mL Oral Q6H PRN Sheldon Standing, MD       amLODipine  (NORVASC ) tablet 10 mg  10 mg Oral Daily Sheldon Standing, MD   10 mg at 06/08/24 0746   budesonide -glycopyrrolate -formoterol  (BREZTRI ) 160-9-4.8 MCG/ACT inhaler 2 puff  2 puff Inhalation BID Sheldon Standing, MD   2 puff at 06/08/24 9257   calcium -vitamin D  (OSCAL WITH D) 500-5 MG-MCG per tablet 2 tablet  2 tablet Oral Daily Sheldon Standing, MD   2 tablet at 06/08/24 0745   carvedilol  (COREG ) tablet 12.5 mg  12.5 mg Oral BID Sheldon Standing, MD   12.5 mg at 06/08/24 0745   cyclobenzaprine  (FLEXERIL ) tablet 10 mg  10 mg Oral TID Sheldon Standing, MD   10 mg at 06/08/24 0744   diphenhydrAMINE  (BENADRYL ) 12.5 MG/5ML elixir 12.5 mg  12.5 mg Oral Q6H PRN Sheldon Standing, MD       Or   diphenhydrAMINE  (BENADRYL ) injection 12.5 mg  12.5 mg Intravenous Q6H PRN Sheldon Standing, MD       enoxaparin  (LOVENOX ) injection 40 mg  40 mg Subcutaneous Q24H Sheldon Standing, MD   40 mg at 06/08/24 0746   feeding supplement (ENSURE SURGERY) liquid 237 mL  237 mL Oral BID BM Sheldon Standing, MD   237 mL at 06/07/24 1421   hydrALAZINE  (APRESOLINE ) injection 10 mg  10 mg Intravenous Q2H PRN Sheldon Standing, MD       HYDROcodone -acetaminophen  (NORCO) 10-325 MG per tablet 0.5-1 tablet  0.5-1 tablet Oral Q4H PRN Sheldon Standing, MD   1 tablet at 06/08/24 0755   HYDROmorphone  (DILAUDID ) injection 0.5-2 mg  0.5-2 mg Intravenous Q4H PRN Sheldon Standing, MD   1 mg at 06/06/24 1629   loratadine  (CLARITIN ) tablet 10 mg  10 mg Oral Daily Sheldon Standing, MD   10 mg at 06/08/24 0746   losartan  (COZAAR ) tablet 100 mg  100 mg Oral Daily Sheldon Standing, MD   100 mg at 06/08/24 9253   magic mouthwash  15 mL Oral QID PRN Sheldon Standing, MD       melatonin tablet 3 mg  3 mg Oral QHS PRN Sheldon Standing, MD   3 mg at 06/07/24 0056   menthol -cetylpyridinium (CEPACOL) lozenge 3 mg  1 lozenge Oral PRN Sheldon Standing, MD       metoprolol  tartrate (LOPRESSOR ) injection 5 mg  5 mg Intravenous Q6H PRN Sheldon Standing, MD       multivitamin with minerals tablet 1 tablet  1 tablet Oral Daily Sheldon Standing, MD   1 tablet at 06/08/24 0745   naphazoline-glycerin  (CLEAR EYES REDNESS) ophth solution 1-2 drop  1-2 drop Both Eyes QID PRN Sheldon Standing, MD       nicotine  (NICODERM CQ  - dosed in mg/24 hours) patch 14 mg  14 mg Transdermal Daily Sheldon Standing, MD   14 mg at 06/08/24 9196   ondansetron  (ZOFRAN ) tablet 4 mg  4 mg Oral Q6H PRN Sheldon Standing, MD       Or   ondansetron  (ZOFRAN ) injection 4 mg  4 mg Intravenous Q6H PRN Sheldon Standing, MD       pantoprazole  (PROTONIX ) EC tablet 40 mg  40 mg Oral Daily Sheldon Standing, MD   40 mg at 06/08/24 0746   phenol (CHLORASEPTIC) mouth spray 2 spray  2 spray Mouth/Throat PRN Sheldon Standing, MD       prochlorperazine  (COMPAZINE ) tablet 10 mg  10 mg Oral Q6H PRN Sheldon Standing, MD       Or   prochlorperazine  (COMPAZINE ) injection 5-10 mg  5-10 mg Intravenous Q6H PRN Sheldon Standing, MD       simethicone  [facility]) chewable tablet 40 mg  40 mg Oral Q6H PRN Sheldon Standing, MD       sodium chloride  (OCEAN) 0.65 % nasal spray 1-2 spray  1-2 spray Each Nare Q6H PRN Sheldon Standing, MD       sodium chloride  flush (NS) 0.9 % injection 3 mL  3 mL Intravenous Q12H Wilkins Elpers, MD   3 mL at 06/08/24 0803   sodium chloride  flush (NS) 0.9 % injection 3 mL  3 mL Intravenous PRN Sheldon Standing, MD  tamoxifen  (NOLVADEX ) tablet 10 mg  10 mg Oral Daily Sheldon Standing, MD   10 mg at 06/08/24 0757   Vitamin D  (Ergocalciferol ) (DRISDOL ) 1.25 MG (50000 UNIT) capsule 50,000 Units  50,000 Units Oral Weekly Sheldon Standing, MD   50,000 Units at 06/04/24 0801     Allergies  Allergen Reactions   Other Other (See Comments)    Years ago, and UNNAMED antidepressant made the patient want to hurt herself- was NOT tolerated well    Signed:   Standing KYM Sheldon, MD, FACS, MASCRS Esophageal, Gastrointestinal & Colorectal Surgery Robotic and Minimally Invasive Surgery  [facility] [address] [place], [place] [address] [phone] Fax [phone] Main  CONTACT INFORMATION: Weekday (9AM-5PM): Call [facility] main office at [phone] Weeknight (5PM-9AM) or Weekend/Holiday: Check EPIC Web Links tab & use AMION (password  [email or web address]) for General Surgery [facility] coverage  Please, DO NOT use SecureChat  (it is not reliable communication to reach operating surgeons & will lead to a delay in care).   Epic staff messaging available for outptient concerns needing 1-2 business day response.      06/08/2024, 8:30 AM

## 2024-06-22 ENCOUNTER — Inpatient Hospital Stay: Payer: Medicare Other | Attending: Hematology and Oncology | Admitting: Hematology and Oncology

## 2024-06-22 VITALS — BP 130/90 | HR 64 | Temp 97.9°F | Resp 17 | Ht 67.0 in | Wt 183.7 lb

## 2024-06-22 DIAGNOSIS — Z79810 Long term (current) use of selective estrogen receptor modulators (SERMs): Secondary | ICD-10-CM | POA: Diagnosis not present

## 2024-06-22 DIAGNOSIS — R232 Flushing: Secondary | ICD-10-CM | POA: Insufficient documentation

## 2024-06-22 DIAGNOSIS — Z170 Estrogen receptor positive status [ER+]: Secondary | ICD-10-CM | POA: Diagnosis not present

## 2024-06-22 DIAGNOSIS — D0512 Intraductal carcinoma in situ of left breast: Secondary | ICD-10-CM | POA: Insufficient documentation

## 2024-06-22 DIAGNOSIS — N951 Menopausal and female climacteric states: Secondary | ICD-10-CM | POA: Diagnosis not present

## 2024-06-22 DIAGNOSIS — Z923 Personal history of irradiation: Secondary | ICD-10-CM | POA: Diagnosis not present

## 2024-06-22 DIAGNOSIS — Z1721 Progesterone receptor positive status: Secondary | ICD-10-CM | POA: Diagnosis not present

## 2024-06-22 MED ORDER — TAMOXIFEN CITRATE 10 MG PO TABS: 10.0000 mg | ORAL_TABLET | Freq: Every day | ORAL | 3 refills | Status: AC

## 2024-06-22 NOTE — Progress Notes (Signed)
 Patient Care Team: Rosamond Leta NOVAK, MD as PCP - General (Internal Medicine) Mallipeddi, Diannah SQUIBB, MD as PCP - Cardiology (Cardiology) Maranda Ellaree CROME, MD as Referring Physician (Surgery) Delford Maude BROCKS, MD as Consulting Physician (Cardiology) Darlean Ozell NOVAK, MD as Consulting Physician (Pulmonary Disease) Alix Charleston, MD as Attending Physician (Neurosurgery) Sheldon Standing, MD as Consulting Physician (Colon and Rectal Surgery) Odean Potts, MD as Consulting Physician (Hematology and Oncology) Vanderbilt Ned, MD as Consulting Physician (General Surgery) Dewey Rush, MD as Consulting Physician (Radiation Oncology) Cinderella Deatrice FALCON, MD as Consulting Physician (Gastroenterology) Devere Lonni Righter, MD as Consulting Physician (Urology)  DIAGNOSIS:  Encounter Diagnosis  Name Primary?   Ductal carcinoma in situ (DCIS) of left breast Yes    SUMMARY OF ONCOLOGIC HISTORY: Oncology History  Ductal carcinoma in situ (DCIS) of left breast  06/03/2023 Initial Diagnosis   Screening mammogram detected left breast distortion 1 cm, biopsy revealed intermediate grade DCIS ER 100%, PR 60%   07/03/2023 Cancer Staging   Staging form: Breast, AJCC 8th Edition - Clinical: Stage 0 (cTis (DCIS), cN0, cM0, G2, ER+, PR+, HER2: Not Assessed) - Signed by Odean Potts, MD on 07/03/2023 Stage prefix: Initial diagnosis Histologic grading system: 3 grade system   07/04/2023 Surgery   Left lumpectomy: Scattered foci of DCIS intermediate grade discontinuously involving a fibrotic area of about 1 cm, margins negative, ER 100%, PR 60%   07/16/2023 Genetic Testing   Negative [facility] Common Hereditary Cancers +RNA Panel.  Report date is 07/16/2023.    The [employer] Common Hereditary Cancers + RNA Panel includes sequencing, deletion/duplication, and RNA analysis of the following 48 genes: APC, ATM, AXIN2, BAP1, BARD1, BMPR1A, BRCA1, BRCA2, BRIP1, CDH1, CDK4*, CDKN2A*, CHEK2, CTNNA1, DICER1, EPCAM* (del/dup  only), FH, GREM1* (promoter dup analysis only), HOXB13*, KIT*, MBD4*, MEN1, MLH1, MSH2, MSH3, MSH6, MUTYH, NF1, NTHL1, PALB2, PDGFRA*, PMS2, POLD1, POLE, PTEN, RAD51C, RAD51D, SDHA (sequencing only), SDHB, SDHC, SDHD, SMAD4, SMARCA4, STK11, TP53, TSC1, TSC2, VHL.  *Genes without RNA analysis.    08/19/2023 - 09/13/2023 Radiation Therapy   Plan Name: Breast_L_BH Site: Breast, Left Technique: 3D Mode: Photon Dose Per Fraction: 2.66 Gy Prescribed Dose (Delivered / Prescribed): 42.56 Gy / 42.56 Gy Prescribed Fxs (Delivered / Prescribed): 16 / 16   Plan Name: Brst_L_BH_Bst Site: Breast, Left Technique: 3D Mode: Photon Dose Per Fraction: 2 Gy Prescribed Dose (Delivered / Prescribed): 8 Gy / 8 Gy Prescribed Fxs (Delivered / Prescribed): 4 / 4   09/2023 -  Anti-estrogen oral therapy   Tamoxifen  x 5 years     CHIEF COMPLIANT: Follow-up on tamoxifen  therapy  HISTORY OF PRESENT ILLNESS: Discussed the use of AI scribe software for clinical note transcription with the patient, who gave verbal consent to proceed.  History of Present Illness Estera Ozier is a 68 year old female on tamoxifen  therapy who presents for follow-up regarding breast cancer treatment.  She has been on tamoxifen  for almost a year and experiences mild to moderate hot flashes and irritability. These symptoms were not present immediately before starting tamoxifen , although she experienced them during menopause. Her boyfriend notes that she seems 'iller' on the medication.  A mammogram performed last month at [facility] was normal. Prior to the mammogram, she experienced aching in her left breast when sleeping on her left side, which she attributed to a biopsy clip. However, post-mammogram, the discomfort resolved. She had believed the biopsy clip was still present, but was informed it had been removed during surgery.  ALLERGIES:  is allergic to other.  MEDICATIONS:  Current Outpatient Medications   Medication Sig Dispense Refill   amLODipine  (NORVASC ) 10 MG tablet Take 10 mg by mouth daily.     carvedilol  (COREG ) 12.5 MG tablet Take 12.5 mg by mouth 2 (two) times daily.     cyclobenzaprine  (FLEXERIL ) 10 MG tablet Take 1 tablet (10 mg total) by mouth 3 (three) times daily. 30 tablet 0   HYDROcodone -acetaminophen  (NORCO) 10-325 MG tablet Take 0.5-1 tablets by mouth every 4 (four) hours as needed for moderate pain (pain score 4-6) or severe pain (pain score 7-10). 30 tablet 0   ibuprofen (ADVIL,MOTRIN) 200 MG tablet Take 400 mg by mouth every 6 (six) hours as needed for headache (or pain).     levocetirizine (XYZAL) 5 MG tablet Take 5 mg by mouth daily.     losartan  (COZAAR ) 100 MG tablet Take 100 mg by mouth every evening.     Multiple Vitamin (MULTIVITAMIN WITH MINERALS) TABS tablet Take 1 tablet by mouth daily. Woman     pantoprazole  (PROTONIX ) 40 MG tablet Take 40 mg by mouth daily before breakfast.     tamoxifen  (NOLVADEX ) 10 MG tablet Take 1 tablet (10 mg total) by mouth daily. 90 tablet 3   TRELEGY ELLIPTA 100-62.5-25 MCG/ACT AEPB Inhale 1 puff into the lungs daily.     Vitamin D , Ergocalciferol , (DRISDOL ) 1.25 MG (50000 UNIT) CAPS capsule Take 50,000 Units by mouth every Monday.     Calcium  Carb-Cholecalciferol  600-20 MG-MCG TABS Take 2 tablets by mouth daily. (Patient not taking: Reported on 06/22/2024)     No current facility-administered medications for this visit.    PHYSICAL EXAMINATION: ECOG PERFORMANCE STATUS: 1 - Symptomatic but completely ambulatory  Vitals:   06/22/24 1516 06/22/24 1524  BP: (!) 150/74 (!) 130/90  Pulse: 64   Resp: 17   Temp: 97.9 F (36.6 C)   SpO2: 97%    Filed Weights   06/22/24 1516  Weight: 183 lb 11.2 oz (83.3 kg)    Physical Exam BREAST: Breasts normal on examination.  (exam performed in the presence of a chaperone)  LABORATORY DATA:  I have reviewed the data as listed    Latest Ref Rng & Units 06/07/2024    5:07 AM 06/06/2024     8:59 AM 06/06/2024    5:23 AM  CMP  Glucose 70 - 99 mg/dL  92    BUN 8 - 23 mg/dL  10    Creatinine 9.55 - 1.00 mg/dL 9.22  9.29  9.33   Sodium 135 - 145 mmol/L  136    Potassium 3.5 - 5.1 mmol/L 4.5  4.4  4.2   Chloride 98 - 111 mmol/L  100    CO2 22 - 32 mmol/L  27    Calcium  8.9 - 10.3 mg/dL  8.8      Lab Results  Component Value Date   WBC 6.5 06/06/2024   HGB 11.6 (L) 06/07/2024   HCT 38.8 06/06/2024   MCV 98.0 06/06/2024   PLT 228 06/06/2024   NEUTROABS 2.8 12/28/2014    ASSESSMENT & PLAN:  Ductal carcinoma in situ (DCIS) of left breast 07/04/2023: Left lumpectomy: Scattered foci of DCIS intermediate grade discontinuously involving a fibrotic area of about 1 cm, margins negative, ER 100%, PR 60%    Treatment plan: adjuvant radiation therapy: Completed 09/13/2023 Followed by antiestrogen therapy with tamoxifen  5 years (10 mg)   Tamoxifen  toxicities: Hot flashes: Mild to moderate Irritability  Breast cancer  surveillance: Breast exam 06/22/2024: Benign Mammogram 05/25/2024 at [facility]: Benign breast density category C  Return to clinic in 1 year for follow-up ------------------------------------- Assessment and Plan Assessment & Plan Ductal carcinoma in situ (DCIS) of left breast, status post surgery, on adjuvant tamoxifen  therapy DCIS of the left breast, post-surgery, on adjuvant tamoxifen  for nearly a year. Mammogram on July 7th showed benign findings, no recurrence. Mild to moderate hot flashes due to tamoxifen . - Continue tamoxifen  therapy. - Refill tamoxifen  prescription and send to pharmacy in [place]. - Perform annual breast exam. - Continue annual mammograms at the same facility.  Hot flashes secondary to tamoxifen  therapy Mild to moderate hot flashes due to tamoxifen , unpredictable and bothersome but not severe.  History of colostomy reversal, recovering well Colostomy reversal three weeks ago with good recovery, some difficulty in  mobility.      No orders of the defined types were placed in this encounter.  The patient has a good understanding of the overall plan. she agrees with it. she will call with any problems that may develop before the next visit here. Total time spent: 30 mins including face to face time and time spent for planning, charting and co-ordination of care   Naomi MARLA Chad, MD 06/22/24

## 2024-06-22 NOTE — Assessment & Plan Note (Signed)
 07/04/2023: Left lumpectomy: Scattered foci of DCIS intermediate grade discontinuously involving a fibrotic area of about 1 cm, margins negative, ER 100%, PR 60%    Treatment plan: adjuvant radiation therapy: Completed 09/13/2023 Followed by antiestrogen therapy with tamoxifen  5 years (10 mg)   Tamoxifen  toxicities:  Breast cancer surveillance: Breast exam 06/22/2024: Benign Mammogram has been ordered  Return to clinic in 1 year for follow-up

## 2024-07-06 ENCOUNTER — Encounter (INDEPENDENT_AMBULATORY_CARE_PROVIDER_SITE_OTHER): Payer: Self-pay | Admitting: Gastroenterology

## 2024-08-05 ENCOUNTER — Ambulatory Visit (INDEPENDENT_AMBULATORY_CARE_PROVIDER_SITE_OTHER): Admitting: Gastroenterology

## 2024-08-17 ENCOUNTER — Encounter (INDEPENDENT_AMBULATORY_CARE_PROVIDER_SITE_OTHER): Payer: Self-pay | Admitting: Gastroenterology

## 2024-08-17 ENCOUNTER — Ambulatory Visit (INDEPENDENT_AMBULATORY_CARE_PROVIDER_SITE_OTHER): Admitting: Gastroenterology

## 2024-08-17 VITALS — BP 177/95 | HR 71 | Temp 98.1°F | Ht 67.0 in | Wt 188.7 lb

## 2024-08-17 DIAGNOSIS — Z8719 Personal history of other diseases of the digestive system: Secondary | ICD-10-CM

## 2024-08-17 DIAGNOSIS — F17210 Nicotine dependence, cigarettes, uncomplicated: Secondary | ICD-10-CM

## 2024-08-17 DIAGNOSIS — I10 Essential (primary) hypertension: Secondary | ICD-10-CM

## 2024-08-17 DIAGNOSIS — Z860101 Personal history of adenomatous and serrated colon polyps: Secondary | ICD-10-CM

## 2024-08-17 DIAGNOSIS — D120 Benign neoplasm of cecum: Secondary | ICD-10-CM | POA: Insufficient documentation

## 2024-08-17 DIAGNOSIS — K5732 Diverticulitis of large intestine without perforation or abscess without bleeding: Secondary | ICD-10-CM | POA: Insufficient documentation

## 2024-08-17 DIAGNOSIS — F17200 Nicotine dependence, unspecified, uncomplicated: Secondary | ICD-10-CM | POA: Insufficient documentation

## 2024-08-17 NOTE — Progress Notes (Signed)
 Glenda Hill Glenda Hill Glenda Hill , M.D. Gastroenterology & Hepatology [facility] [address] [place], [place] [address] Primary Care Physician: Glenda Leta NOVAK, MD [address] [place] [address]  Chief Complaint: History of diverticulitis, colon polyps  History of Present Illness: Glenda Hill is a 68 y.o. 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, diagnosis of breast cancer (05/2023) , underwent reversal of the ostomy(05/2024) is here for follow-up  Patient underwent colostomy reversal /left colectomy with Dr. Sheldon 05/2024.  Patient is doing well has no active complaints.  Previously was taking significant amount of NSAIDs which she has stopped and now takes Tylenol  as needed.  Patient continues to smoke cigarettes .the patient denies having any nausea, vomiting, fever, chills, hematochezia, melena, hematemesis, abdominal distention, abdominal pain, diarrhea, jaundice, pruritus or weight loss.    Last ZHI:wnwz Last Colonoscopy:05/2024  - Stool in the rectum. 25cm of rectum and sigmoid colon seen from colonoscopy performed via anal verge - One 5 mm polyp in the cecum, removed with a cold snare. Resected and retrieved. - Widely patent loop colostomy with healthy appearing mucosa in the sigmoid colon. - Ostomy site has two opening , both openings examined . One opening reaching to surgical site( 30 cm in length) and other opening leading to the cecum . No apparent masses - incomplete insufflation due to CO2 leaking through the ostomy and hence small polyps may be missed. No apparent masses seen - Pandiverticulosis  A. CECAL POLYPECTOMY:  Tubular adenoma (1) without high grade dysplasia.  Multiple additional levels examined.    FHx: neg for any gastrointestinal/liver disease, no malignancies Social: Active smoker Surgical: Diverting colostomy 2022  Past Medical  History: Past Medical History:  Diagnosis Date   Colostomy in place [facility])    Complication of anesthesia    trouble waking up after C section   COPD (chronic obstructive pulmonary disease) (HCC)    Diverticulitis    Ductal carcinoma in situ (DCIS) of left breast 2024   GERD (gastroesophageal reflux disease)    Hypertension     Past Surgical History: Past Surgical History:  Procedure Laterality Date   BACK SURGERY     x2 2014 and 2018   BREAST BIOPSY Left 06/03/2023   MM LT BREAST BX W LOC DEV 1ST LESION IMAGE BX SPEC STEREO GUIDE 06/03/2023 [facility]   BREAST BIOPSY  07/02/2023   MM LT RADIOACTIVE SEED LOC MAMMO GUIDE 07/02/2023 [facility]   BREAST LUMPECTOMY WITH RADIOACTIVE SEED LOCALIZATION Left 07/04/2023   Procedure: LEFT BREAST LUMPECTOMY WITH RADIOACTIVE SEED LOCALIZATION;  Surgeon: Vanderbilt Ned, MD;  Location: [facility] OR;  Service: General;  Laterality: Left;   CESAREAN SECTION     COLECTOMY  09/2021   COLECTOMY, SIGMOID, ROBOT-ASSISTED N/A 06/03/2024   Procedure: COLECTOMY, SIGMOID, ROBOT-ASSISTED;  Surgeon: Sheldon Standing, MD;  Location: [facility];  Service: General;  Laterality: N/A;  COLECTOMY   COLONOSCOPY N/A 05/28/2024   Procedure: COLONOSCOPY;  Surgeon: Cinderella Deatrice FALCON, MD;  Location: [facility];  Service: Endoscopy;  Laterality: N/A;  1245pm, asa 1-2   CYSTOSCOPY WITH INDOCYANINE GREEN  IMAGING (ICG) N/A 06/03/2024   Procedure: CYSTOSCOPY WITH INDOCYANINE GREEN  IMAGING (ICG);  Surgeon: Devere Lonni Righter, MD;  Location: [facility];  Service: Urology;  Laterality: N/A;   FLEXIBLE SIGMOIDOSCOPY N/A 06/03/2024   Procedure: KINGSTON SIDE;  Surgeon: Sheldon Standing, MD;  Location: [facility];  Service: General;  Laterality: N/A;  ROBOTIC ASSISTED LAPAROSCOPIC LYSIS OF ADHESION N/A 06/03/2024   Procedure: LYSIS, ADHESIONS, ROBOT-ASSISTED, LAPAROSCOPIC;  Surgeon: Sheldon Standing, MD;  Location: [facility];  Service: General;  Laterality: N/A;  LYSIS OF ADHESIONS    TONSILLECTOMY     removed as a child   XI ROBOTIC ASSISTED COLOSTOMY TAKEDOWN N/A 06/03/2024   Procedure: CLOSURE, COLOSTOMY, ROBOT-ASSISTED;  Surgeon: Sheldon Standing, MD;  Location: [facility];  Service: General;  Laterality: N/A;  ROBOTIC OSTOMY TAKEDOWN    Family History: Family History  Problem Relation Age of Onset   Heart disease Mother    Lung cancer Mother 58       smoking hx   Emphysema Mother        smoking hx   Ovarian cancer Sister        dx 35-50   Cervical cancer Paternal Grandmother        dx unknown age   Arthritis Other    Lung disease Other    Diabetes Other    Cancer Cousin 60       stomach and lung; pat female cousin    Social History: Social History   Tobacco Use  Smoking Status Every Day   Current packs/day: 1.00   Average packs/day: 1 pack/day for 51.7 years (51.7 ttl pk-yrs)   Types: Cigarettes   Start date: 1974  Smokeless Tobacco Never  Tobacco Comments   stopped for a while    Social History   Substance and Sexual Activity  Alcohol Use Not Currently   Social History   Substance and Sexual Activity  Drug Use No    Allergies: Allergies  Allergen Reactions   Other Other (See Comments)    Years ago, and UNNAMED antidepressant made the patient want to hurt herself- was NOT tolerated well    Medications: Current Outpatient Medications  Medication Sig Dispense Refill   amLODipine  (NORVASC ) 10 MG tablet Take 10 mg by mouth daily.     Calcium  Carb-Cholecalciferol  600-20 MG-MCG TABS Take 2 tablets by mouth daily.     carvedilol  (COREG ) 12.5 MG tablet Take 12.5 mg by mouth 2 (two) times daily.     cyclobenzaprine  (FLEXERIL ) 10 MG tablet Take 1 tablet (10 mg total) by mouth 3 (three) times daily. 30 tablet 0   HYDROcodone -acetaminophen  (NORCO) 10-325 MG tablet Take 0.5-1 tablets by mouth every 4 (four) hours as needed for moderate pain (pain score 4-6) or severe pain (pain score 7-10). 30 tablet 0   ibuprofen (ADVIL,MOTRIN) 200 MG tablet  Take 400 mg by mouth every 6 (six) hours as needed for headache (or pain).     levocetirizine (XYZAL) 5 MG tablet Take 5 mg by mouth daily.     losartan  (COZAAR ) 100 MG tablet Take 100 mg by mouth every evening.     Multiple Vitamin (MULTIVITAMIN WITH MINERALS) TABS tablet Take 1 tablet by mouth daily. Woman     pantoprazole  (PROTONIX ) 40 MG tablet Take 40 mg by mouth daily before breakfast.     tamoxifen  (NOLVADEX ) 10 MG tablet Take 1 tablet (10 mg total) by mouth daily. 90 tablet 3   TRELEGY ELLIPTA 100-62.5-25 MCG/ACT AEPB Inhale 1 puff into the lungs daily.     Vitamin D , Ergocalciferol , (DRISDOL ) 1.25 MG (50000 UNIT) CAPS capsule Take 50,000 Units by mouth every Monday.     No current facility-administered medications for this visit.    Review of Systems: GENERAL: negative for malaise, night sweats HEENT: No changes in hearing or vision, no nose bleeds  or other nasal problems. NECK: Negative for lumps, goiter, pain and significant neck swelling RESPIRATORY: Negative for cough, wheezing CARDIOVASCULAR: Negative for chest pain, leg swelling, palpitations, orthopnea GI: SEE HPI MUSCULOSKELETAL: Negative for joint pain or swelling, back pain, and muscle pain. SKIN: Negative for lesions, rash HEMATOLOGY Negative for prolonged bleeding, bruising easily, and swollen nodes. ENDOCRINE: Negative for cold or heat intolerance, polyuria, polydipsia and goiter. NEURO: negative for tremor, gait imbalance, syncope and seizures. The remainder of the review of systems is noncontributory.   Physical Exam: BP (!) 177/95   Pulse 71   Temp 98.1 F (36.7 C)   Ht 5' 7 (1.702 m)   Wt 188 lb 11.2 oz (85.6 kg)   BMI 29.55 kg/m  GENERAL: The patient is AO x3, in no acute distress. HEENT: Head is normocephalic and atraumatic. EOMI are intact. Mouth is well hydrated and without lesions. NECK: Supple. No masses LUNGS: Clear to auscultation. No presence of rhonchi/wheezing/rales. Adequate chest  expansion HEART: RRR, normal s1 and s2. ABDOMEN: Soft, nontender, no guarding, no peritoneal signs, and nondistended. BS +. No masses.   Imaging/Labs: as above     Latest Ref Rng & Units 06/07/2024    5:07 AM 06/06/2024    8:59 AM 06/06/2024    5:23 AM  CBC  WBC 4.0 - 10.5 K/uL  6.5    Hemoglobin 12.0 - 15.0 g/dL 88.3  87.8  88.0   Hematocrit 36.0 - 46.0 %  38.8    Platelets 150 - 400 K/uL  228     No results found for: IRON, TIBC, FERRITIN  I personally reviewed and interpreted the available labs, imaging and endoscopic files.  Impression and Plan:  Glenda Hill is a 68 y.o. 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, diagnosis of breast cancer (05/2023) , underwent reversal of the ostomy(05/2024) is here for follow-up  # History of diverticulitis #Colon polyps   Patient is back to baseline Patient was advised strongly smoking cessation and limiting amount of NSAID use Recommended high-fiber diet and preventing constipation  Will plan for colonoscopy in 1 year as last colonoscopy there was incomplete insufflation due to CO2 leaking through the ostomy and hence small polyps may be missed.  1 diminutive tubular adenoma was removed  #HTN  The patient was found to have elevated blood pressure when vital signs were checked in the office. The blood pressure was rechecked by the nursing staff and it was found be persistently elevated >140/90 mmHg. I personally advised to the patient to follow up closely with PCP for hypertension control.   All questions were answered.      Lissie Hinesley Glenda Hill Ladd Cen, MD Gastroenterology and Hepatology [facility]astroenterology   This chart has been completed using [facility] Dictation software, and while attempts have been made to ensure accuracy , certain words and phrases may not be transcribed as intended

## 2024-08-26 ENCOUNTER — Ambulatory Visit (HOSPITAL_COMMUNITY)

## 2024-09-21 ENCOUNTER — Ambulatory Visit (HOSPITAL_COMMUNITY)
Admission: RE | Admit: 2024-09-21 | Discharge: 2024-09-21 | Disposition: A | Discharge status: Home / Self Care | Source: Ambulatory Visit

## 2024-09-21 DIAGNOSIS — R918 Other nonspecific abnormal finding of lung field: Secondary | ICD-10-CM | POA: Insufficient documentation

## 2024-09-21 DIAGNOSIS — F17200 Nicotine dependence, unspecified, uncomplicated: Secondary | ICD-10-CM | POA: Diagnosis present

## 2024-09-21 DIAGNOSIS — D0512 Intraductal carcinoma in situ of left breast: Secondary | ICD-10-CM | POA: Diagnosis present

## 2024-09-21 DIAGNOSIS — F17210 Nicotine dependence, cigarettes, uncomplicated: Secondary | ICD-10-CM | POA: Diagnosis present

## 2025-06-22 ENCOUNTER — Ambulatory Visit: Admitting: Hematology and Oncology
